# Patient Record
Sex: Female | Born: 1990 | Race: Black or African American | Hispanic: No | Smoking: Never smoker
Health system: Southern US, Community
[De-identification: ages and names within clinical notes are randomized; demographics above are authoritative.]

## PROBLEM LIST (undated history)

## (undated) ENCOUNTER — Inpatient Hospital Stay (HOSPITAL_COMMUNITY): Payer: Self-pay

## (undated) DIAGNOSIS — D649 Anemia, unspecified: Secondary | ICD-10-CM

## (undated) DIAGNOSIS — Z8279 Family history of other congenital malformations, deformations and chromosomal abnormalities: Secondary | ICD-10-CM

## (undated) HISTORY — PX: NO PAST SURGERIES: SHX2092

## (undated) HISTORY — DX: Family history of other congenital malformations, deformations and chromosomal abnormalities: Z82.79

## (undated) HISTORY — DX: Anemia, unspecified: D64.9

## (undated) SURGERY — Surgical Case
Anesthesia: *Unknown

---
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
## 2011-07-05 ENCOUNTER — Emergency Department (HOSPITAL_COMMUNITY)
Admission: EM | Admit: 2011-07-05 | Discharge: 2011-07-05 | Disposition: A | Discharge status: Home / Self Care | Payer: Medicaid Other | Attending: Emergency Medicine | Admitting: Emergency Medicine

## 2011-07-05 ENCOUNTER — Encounter (HOSPITAL_COMMUNITY): Payer: Self-pay | Admitting: Emergency Medicine

## 2011-07-05 ENCOUNTER — Emergency Department (HOSPITAL_COMMUNITY): Payer: Medicaid Other

## 2011-07-05 DIAGNOSIS — Z3490 Encounter for supervision of normal pregnancy, unspecified, unspecified trimester: Secondary | ICD-10-CM

## 2011-07-05 DIAGNOSIS — R109 Unspecified abdominal pain: Secondary | ICD-10-CM | POA: Insufficient documentation

## 2011-07-05 DIAGNOSIS — O200 Threatened abortion: Secondary | ICD-10-CM

## 2011-07-05 LAB — ABO/RH: ABO/RH(D): B POS

## 2011-07-05 LAB — URINALYSIS, ROUTINE W REFLEX MICROSCOPIC
Leukocytes, UA: NEGATIVE
Nitrite: NEGATIVE
Protein, ur: NEGATIVE mg/dL
Urobilinogen, UA: 1 mg/dL (ref 0.0–1.0)

## 2011-07-05 LAB — URINE MICROSCOPIC-ADD ON

## 2011-07-05 LAB — HCG, QUANTITATIVE, PREGNANCY: hCG, Beta Chain, Quant, S: 8746 m[IU]/mL — ABNORMAL HIGH (ref <5)

## 2011-07-05 LAB — CBC
MCHC: 33 g/dL (ref 30.0–36.0)
RDW: 12.9 % (ref 11.5–15.5)

## 2011-07-05 LAB — POCT PREGNANCY, URINE: Preg Test, Ur: POSITIVE — AB

## 2011-07-05 NOTE — ED Notes (Signed)
Patient transported to Ultrasound 

## 2011-07-05 NOTE — ED Notes (Signed)
C/o abd cramping all day and light amount of dark brown vaginal bleeding x 1 hour.  Pt reports she is [redacted] weeks pregnant.

## 2011-07-05 NOTE — Discharge Instructions (Signed)
Threatened Miscarriage  Bleeding during the first 20 weeks of pregnancy is common. This is sometimes called a threatened miscarriage. This is a pregnancy that is threatening to end before the twentieth week of pregnancy. Often this bleeding stops with bed rest or decreased activities as suggested by your caregiver and the pregnancy continues without any more problems. You may be asked to not have sexual intercourse, have orgasms or use tampons until further notice. Sometimes a threatened miscarriage can progress to a complete or incomplete miscarriage. This may or may not require further treatment. Some miscarriages occur before a woman misses a menstrual period and knows she is pregnant.  Miscarriages occur in 15 to 20% of all pregnancies and usually occur during the first 13 weeks of the pregnancy. The exact cause of a miscarriage is usually never known. A miscarriage is natures way of ending a pregnancy that is abnormal or would not make it to term. There are some things that may put you at risk to have a miscarriage, such as:   Hormone problems.   Infection of the uterus or cervix.   Chronic illness, diabetes for example, especially if it is not controlled.   Abnormal shaped uterus.   Fibroids in the uterus.   Incompetent cervix (the cervix is too weak to hold the baby).   Smoking.   Drinking too much alcohol. It's best not to drink any alcohol when you are pregnant.   Taking illegal drugs.  TREATMENT   When a miscarriage becomes complete and all products of conception (all the tissue in the uterus) have been passed, often no treatment is needed. If you think you passed tissue, save it in a container and take it to your doctor for evaluation. If the miscarriage is incomplete (parts of the fetus or placenta remain in the uterus), further treatment may be needed. The most common reason for further treatment is continued bleeding (hemorrhage) because pregnancy tissue did not pass out of the uterus. This  often occurs if a miscarriage is incomplete. Tissue left behind may also become infected. Treatment usually is dilatation and curettage (the removal of the remaining products of pregnancy. This can be done by a simple sucking procedure (suction curettage) or a simple scraping of the inside of the uterus. This may be done in the hospital or in the caregiver's office. This is only done when your caregiver knows that there is no chance for the pregnancy to proceed to term. This is determined by physical examination, negative pregnancy test, falling pregnancy hormone count and/or, an ultrasound revealing a dead fetus.  Miscarriages are often a very emotional time for prospective mothers and fathers. This is not you or your partners fault. It did not occur because of an inadequacy in you or your partner. Nearly all miscarriages occur because the pregnancy has started off wrongly. At least half of these pregnancies have a chromosomal abnormality. It is almost always not inherited. Others may have developmental problems with the fetus or placenta. This does not always show up even when the products miscarried are studied under the microscope. The miscarriage is nearly always not your fault and it is not likely that you could have prevented it from happening. If you are having emotional and grieving problems, talk to your health care provider and even seek counseling, if necessary, before getting pregnant again. You can begin trying for another pregnancy as soon as your caregiver says it is OK.  HOME CARE INSTRUCTIONS    Your caregiver may order   bed rest depending on how much bleeding and cramping you are having. You may be limited to only getting up to go to the bathroom. You may be allowed to continue light activity. You may need to make arrangements for the care of your other children and for any other responsibilities.   Keep track of the number of pads you use each day, how often you have to change pads and how  saturated (soaked) they are. Record this information.   DO NOT USE TAMPONS. Do not douche, have sexual intercourse or orgasms until approved by your caregiver.   You may receive a follow up appointment for re-evaluation of your pregnancy and a repeat blood test. Re-evaluation often occurs after 2 days and again in 4 to 6 weeks. It is very important that you follow-up in the recommended time period.   If you are Rh negative and the father is Rh positive or you do not know the fathers' blood type, you may receive a shot (Rh immune globulin) to help prevent abnormal antibodies that can develop and affect the baby in any future pregnancies.  SEEK IMMEDIATE MEDICAL CARE IF:   You have severe cramps in your stomach, back, or abdomen.   You have a sudden onset of severe pain in the lower part of your abdomen.   You develop chills.   You run an unexplained temperature of 101 F (38.3 C) or higher.   You pass large clots or tissue. Save any tissue for your caregiver to inspect.   Your bleeding increases or you become light-headed, weak, or have fainting episodes.   You have a gush of fluid from your vagina.   You pass out. This could mean you have a tubal (ectopic) pregnancy.  Document Released: 01/21/2005 Document Revised: 01/10/2011 Document Reviewed: 09/07/2007  [employer] Patient Information 2012 [employer].

## 2011-07-05 NOTE — ED Notes (Signed)
Patient verbalized complete understanding of all the d/c home instructions

## 2011-07-05 NOTE — ED Provider Notes (Signed)
I have seen and examined this patient with the resident.  I agree with the resident's note, assessment and plan except as indicated.    Patient with vaginal bleeding and abdominal cramping with a positive pregnancy test concerning for possible ectopic pregnancy.  Will obtain ultrasound and appropriate labs to further evaluate this possibility.  Nat Christen, MD 07/05/11 [phone]

## 2011-07-05 NOTE — ED Notes (Signed)
Patient is resting comfortably. 

## 2011-07-05 NOTE — ED Provider Notes (Signed)
History     CSN: [id number]  Arrival date & time 07/05/11  1726   First MD Initiated Contact with Patient 07/05/11 1809      Chief Complaint  Patient presents with  . Vaginal Bleeding    (Consider location/radiation/quality/duration/timing/severity/associated sxs/prior treatment) HPI Comments: Some spotting and lower abdominal cramping today c/w menstraul periods.  [redacted] weeks pregnant.  Patient is a 21 y.o. female presenting with vaginal bleeding. The history is provided by the patient.  Vaginal Bleeding This is a new problem. The current episode started today. The problem occurs constantly. The problem has been unchanged. Associated symptoms include abdominal pain (lower abdominal pain). Pertinent negatives include no chest pain, congestion, fever, rash or vomiting. The symptoms are aggravated by nothing. She has tried nothing for the symptoms.    History reviewed. No pertinent past medical history.  History reviewed. No pertinent past surgical history.  No family history on file.  History  Substance Use Topics  . Smoking status: Never Smoker   . Smokeless tobacco: Not on file  . Alcohol Use: Yes    OB History    Grav Para Term Preterm Abortions TAB SAB Ect Mult Living   1               Review of Systems  Constitutional: Negative for fever and activity change.  HENT: Negative for congestion.   Eyes: Negative for visual disturbance.  Respiratory: Negative for chest tightness and shortness of breath.   Cardiovascular: Negative for chest pain and leg swelling.  Gastrointestinal: Positive for abdominal pain (lower abdominal pain). Negative for vomiting.  Genitourinary: Positive for vaginal bleeding. Negative for dysuria.  Skin: Negative for rash.  Neurological: Negative for syncope.  Psychiatric/Behavioral: Negative for behavioral problems.    Allergies  Review of patient's allergies indicates no known allergies.  Home Medications  No current outpatient  prescriptions on file.  BP 100/66  Pulse 78  Temp(Src) 98.2 F (36.8 C) (Oral)  Resp 16  SpO2 100%  LMP 05/22/2011  Physical Exam  Constitutional: She is oriented to person, place, and time. She appears well-developed and well-nourished.  HENT:  Head: Normocephalic and atraumatic.  Eyes: Conjunctivae and EOM are normal. Pupils are equal, round, and reactive to light. No scleral icterus.  Neck: Normal range of motion. Neck supple.  Cardiovascular: Normal rate and regular rhythm.  Exam reveals no gallop and no friction rub.   No murmur heard. Pulmonary/Chest: Effort normal and breath sounds normal. No respiratory distress. She has no wheezes. She has no rales. She exhibits no tenderness.  Abdominal: Soft. She exhibits no distension and no mass. There is no tenderness. There is no rebound and no guarding.  Genitourinary:       No CMT.  No blood in vault.  No discharge.  Os closed.  Musculoskeletal: Normal range of motion.  Neurological: She is alert and oriented to person, place, and time. She has normal reflexes. No cranial nerve deficit.  Skin: Skin is warm and dry. No rash noted.  Psychiatric: She has a normal mood and affect. Her behavior is normal. Judgment and thought content normal.    ED Course  Procedures (including critical care time)  Labs Reviewed  URINALYSIS, ROUTINE W REFLEX MICROSCOPIC - Abnormal; Notable for the following:    Hgb urine dipstick TRACE (*)    All other components within normal limits  POCT PREGNANCY, URINE - Abnormal; Notable for the following:    Preg Test, Ur POSITIVE (*)    All  other components within normal limits  CBC - Abnormal; Notable for the following:    Hemoglobin 11.4 (*)    HCT 34.5 (*)    MCV 77.4 (*)    MCH 25.6 (*)    All other components within normal limits  HCG, QUANTITATIVE, PREGNANCY - Abnormal; Notable for the following:    hCG, Beta Chain, [name], S 8746 (*)    All other components within normal limits  WET PREP, GENITAL  - Abnormal; Notable for the following:    Clue Cells Wet Prep HPF POC FEW (*)    WBC, Wet Prep HPF POC FEW (*)    All other components within normal limits  URINE MICROSCOPIC-ADD ON - Abnormal; Notable for the following:    Squamous Epithelial / LPF MANY (*)    All other components within normal limits  ABO/RH  PREGNANCY, URINE  GC/CHLAMYDIA PROBE AMP, GENITAL   US Ob Comp Less 14 Wks  07/05/2011  *RADIOLOGY REPORT*  Clinical Data: VAGINAL BLEEDING, pregnant, r/o ectopic,protocol; ;  OBSTETRIC <14 WK [place] AND TRANSVAGINAL OB US  Technique: Both transabdominal and transvaginal ultrasound examinations were performed for complete evaluation of the gestation as well as the maternal uterus, adnexal regions, and pelvic cul-de-sac.  Comparison: None.  Findings: There is a single intrauterine gestation.  Mean sac diameter is 13.7 mm for estimated gestational age of [redacted] weeks 2 days.  Yolk sac is present.  No fetal pole currently visualized. No subchorionic hemorrhage.  Uterus is retroverted.  Corpus luteal cyst on the right ovary.  No adnexal masses.  Trace free fluid.  IMPRESSION: Early intrauterine pregnancy, 6 weeks 2 days by mean sac diameter. No fetal pole identified currently.  There is a yolk sac present. Recommend follow-up ultrasound in 7-10 days.  Retroverted uterus.  Original Report Authenticated By: Cyndie Chime, M.D.   US Ob Transvaginal  07/05/2011  *RADIOLOGY REPORT*  Clinical Data: VAGINAL BLEEDING, pregnant, r/o ectopic,protocol; ;  OBSTETRIC <14 WK [place] AND TRANSVAGINAL OB US  Technique: Both transabdominal and transvaginal ultrasound examinations were performed for complete evaluation of the gestation as well as the maternal uterus, adnexal regions, and pelvic cul-de-sac.  Comparison: None.  Findings: There is a single intrauterine gestation.  Mean sac diameter is 13.7 mm for estimated gestational age of [redacted] weeks 2 days.  Yolk sac is present.  No fetal pole currently visualized. No subchorionic  hemorrhage.  Uterus is retroverted.  Corpus luteal cyst on the right ovary.  No adnexal masses.  Trace free fluid.  IMPRESSION: Early intrauterine pregnancy, 6 weeks 2 days by mean sac diameter. No fetal pole identified currently.  There is a yolk sac present. Recommend follow-up ultrasound in 7-10 days.  Retroverted uterus.  Original Report Authenticated By: Cyndie Chime, M.D.     1. Pregnant   2. Threatened abortion       MDM  Some spotting and lower abdominal cramping today c/w menstraul periods.  [redacted] weeks pregnant.  Feels like prior threatened abortions.  No lightheadedness or dizziness.  Labs unconcerning.  IUP on [place].  OB f/u.  Pt comfortable with plan and will follow up.         Army Chaco, MD 07/05/11 2121

## 2011-07-06 LAB — GC/CHLAMYDIA PROBE AMP, GENITAL: GC Probe Amp, Genital: NEGATIVE

## 2011-07-09 ENCOUNTER — Other Ambulatory Visit: Payer: Self-pay | Admitting: Nurse Practitioner

## 2011-07-09 DIAGNOSIS — Z8279 Family history of other congenital malformations, deformations and chromosomal abnormalities: Secondary | ICD-10-CM

## 2011-07-09 DIAGNOSIS — O26849 Uterine size-date discrepancy, unspecified trimester: Secondary | ICD-10-CM

## 2011-07-12 ENCOUNTER — Other Ambulatory Visit: Payer: Self-pay | Admitting: Nurse Practitioner

## 2011-07-12 ENCOUNTER — Ambulatory Visit (HOSPITAL_COMMUNITY)
Admission: RE | Admit: 2011-07-12 | Discharge: 2011-07-12 | Disposition: A | Discharge status: Home / Self Care | Payer: Medicaid Other | Source: Ambulatory Visit | Attending: Nurse Practitioner | Admitting: Nurse Practitioner

## 2011-07-12 ENCOUNTER — Encounter (HOSPITAL_COMMUNITY): Payer: Self-pay

## 2011-07-12 DIAGNOSIS — O352XX0 Maternal care for (suspected) hereditary disease in fetus, not applicable or unspecified: Secondary | ICD-10-CM | POA: Insufficient documentation

## 2011-07-12 DIAGNOSIS — Z3689 Encounter for other specified antenatal screening: Secondary | ICD-10-CM | POA: Insufficient documentation

## 2011-07-12 DIAGNOSIS — Z8279 Family history of other congenital malformations, deformations and chromosomal abnormalities: Secondary | ICD-10-CM

## 2011-07-12 DIAGNOSIS — O26849 Uterine size-date discrepancy, unspecified trimester: Secondary | ICD-10-CM

## 2011-07-12 NOTE — Progress Notes (Signed)
Patient seen today  For ultrasound.  See full report in [facility].  Alpha Gula, MD

## 2011-07-16 ENCOUNTER — Encounter (HOSPITAL_COMMUNITY): Payer: Self-pay

## 2011-07-16 NOTE — Progress Notes (Signed)
Genetic Counseling  High-Risk Gestation Note  Appointment Date:  07/12/2011 Referred By: Elizabeth Baltimore, NP Date of Birth:  [birth year]   Pregnancy History: [id number] Estimated Date of Delivery: 03/04/12 Estimated Gestational Age: [redacted]w[redacted]d Attending: Alpha Gula, MD   [name] was seen for genetic counseling because of a previous pregnancy with congenital anomalies.  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 Given this finding, the patient elected to terminate that pregnancy. 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 However, the patient does not recall any chromosome analysis being performed and is not sure how this diagnosis was determined. We discussed the option of obtaining and reviewing medical records from the previous pregnancy to determined whether or not an underlying diagnosis had been determined. The patient declined this option at this time, stating that she received care in [place] but did not remember the specific physician or office.   We discussed that a cystic hygroma describes a septated fluid filled sac at the back of the neck that typically results from failure of the fetal lymphatic system.  We discussed the various etiologies for a hygroma including normal variation (immature lymphatic system), a chromosome condition (in approximately 50-60% of cases), single gene condition, or a congenital anomaly (heart defect).   We reviewed chromosomes and nondisjunction in detail.  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  The missing X chromosome is present from the time of conception, and could have resulted from maternal meiotic nondisjunction, or more commonly (80%) from  paternal nondisjunction.  We discussed that Turner syndrome is a sporadic condition and is not the result of anything that a parent did or did not do.  She was counseled that when a child has missing or extra chromosome material, that child has an increased risk to have birth defects, neurodevelopmental differences, and a variety of health concerns depending on the specific chromosome alteration. Turner syndrome is a multisystem disorder occuring in approximately 1 in 20,000 female live births.  Because every cell of the body is missing an X chromosome, virtually all organ systems can be affected.  Lymphedema, cardiac and renal anomalies, short stature, developmental delay, and infertility due to ovarian dysfunction are commonly described in girls with this condition.  Pregnancies with Turner syndrome have an increased risk for miscarriage or stillbirth.  The risk for neonatal death is also increased.   Once a couple has had one pregnancy with Turner syndrome, the risk of recurrence for a future pregnancy does not appear to be increased above the risk of the general population.  Additionally, the chance to have a pregnancy with Turner syndrome does not increase with maternal age as it does with other extra chromosome conditions, such as Down syndrome. To summarize, [name] was counseled that the current pregnancy is not expected to be at increased risk for Turner syndrome, even in the event that her previous pregnancy was confirmed to have Turner syndrome. Additionally, given the patient's age alone, the pregnancy is not considered to be at increased risk for other fetal aneuploidy. However, we do not currently have confirmation of Turner syndrome or other underlying etiology for cystic hygroma in the patient's first pregnancy. Recurrence risk for cystic hygroma depends upon  the underlying etiology. In the absence of a determined etiology, accurate recurrence risk assessment for the current pregnancy cannot  be determined.    However, we did review available screening and diagnostic options for chromosome conditions, including Turner syndrome and reviewed first trimester nuchal translucency assessment at 11-[redacted] weeks gestation to assess for the presence of a cystic hygroma. Specifically, we reviewed the option of First trimester screening, noninvasive prenatal testing (NIPT), also referred to as cell free fetal DNA (cffDNA) testing, and detailed ultrasound.  NIPT analyzes cffDNA in maternal circulation from chromosomes 21, 13, 18, X, and Y.  Turner syndrome is screened for with this testing methodology.  Specifically, we discussed that NIPT analyzes cell free fetal DNA found in the maternal circulation. This test is not diagnostic for chromosome conditions, but can provide information regarding the presence or absence of extra fetal DNA for chromosomes 13, 18, 21, X, and Y, and missing fetal DNA for chromosome X and Y (Turner syndrome). Thus, it would not identify or rule out all genetic conditions. The reported detection rate is greater than 99% for Trisomy 21, greater than 98% for Trisomy 18, and is approximately 80% (8 out of 10) for Trisomy 13. The false positive rate is reported to be less than 0.1% for any of these conditions. In addition, detailed ultrasound can detect many of the anomalies commonly observed in fetuses with Turner syndrome, as well as other chromosome conditions. However, we do not have confirmation that a chromosome condition was present in the patient's first pregnancy.   We also reviewed the availability of diagnostic testing options including both CVS and amniocentesis.  The risks, benefits, and limitations of each of these options were reviewed in detail.  She understands that the chance for a loss of pregnancy related to CVS/amniocentesis is estimated to be greater than the risk of aneuploidy in the current pregnancy.  After thoughtful consideration of these options, [name]  elected to return for nuchal translucency ultrasound assessment on 08/23/11 and proceed with NIPT ([facility]) at that time. She declined CVS and amniocentesis at this time. An ultrasound was performed at the time of today's visit, which visualized the pregnancy to be [redacted]w[redacted]d gestation. Complete ultrasound results reported separately.   Additionally, the patient reported a female maternal first cousin once removed (her mother's maternal uncle's son) with mental retardation. An underlying cause was not known. We discussed that there are many different causes of mental retardation such as genetic differences, sporadic causes, or injuries.  A specific diagnosis for mental retardation can be determined in approximately 50% of cases.  In the remaining 50% of cases, a diagnosis may not ever be determined.  Without more specific information, potential genetic risks cannot be determined.  The patient was advised to call if she is able to find out more information regarding a specific diagnosis.  Without further information regarding the provided family history, an accurate genetic risk cannot be calculated. Further genetic counseling is warranted if more information is obtained.  [name] was provided with written information regarding sickle cell anemia (SCA) including the carrier frequency and incidence in the African-American population, the availability of carrier testing and prenatal diagnosis if indicated.  In addition, we discussed that hemoglobinopathies are routinely screened for as part of the [place] newborn screening panel.  She reported that she previously had sickle cell screening, which was negative. We do not currently have medical records confirming this report and indicating whether this screened for all hemoglobin variants or hemoglobin  S only. If not previously performed, a hemoglobin electrophoresis is available, if desired, to assess for hemoglobin variants.    [name] denied exposure to  environmental toxins or chemical agents. She reported one dental xray 4 weeks ago. The all-or-none period was discussed, meaning exposures that occur in the first 4 weeks of gestation are typically thought to either not affect the pregnancy at all or result in a miscarriage. She denied the use of alcohol, tobacco or street drugs. She denied significant viral illnesses during the course of her pregnancy. Her medical and surgical histories were noncontributory.   I counseled [name] for approximately 25 minutes regarding the above risks and available options.     Quinn Plowman, MS,  Certified Genetic Counselor 07/16/2011

## 2011-07-27 ENCOUNTER — Encounter (HOSPITAL_COMMUNITY): Payer: Self-pay | Admitting: Nurse Practitioner

## 2011-07-27 ENCOUNTER — Emergency Department (HOSPITAL_COMMUNITY)
Admission: EM | Admit: 2011-07-27 | Discharge: 2011-07-27 | Disposition: A | Discharge status: Home / Self Care | Payer: Medicaid Other | Attending: Emergency Medicine | Admitting: Emergency Medicine

## 2011-07-27 DIAGNOSIS — B9689 Other specified bacterial agents as the cause of diseases classified elsewhere: Secondary | ICD-10-CM

## 2011-07-27 DIAGNOSIS — O200 Threatened abortion: Secondary | ICD-10-CM | POA: Insufficient documentation

## 2011-07-27 DIAGNOSIS — N760 Acute vaginitis: Secondary | ICD-10-CM

## 2011-07-27 DIAGNOSIS — D649 Anemia, unspecified: Secondary | ICD-10-CM | POA: Insufficient documentation

## 2011-07-27 DIAGNOSIS — O99019 Anemia complicating pregnancy, unspecified trimester: Secondary | ICD-10-CM | POA: Insufficient documentation

## 2011-07-27 LAB — WET PREP, GENITAL: Yeast Wet Prep HPF POC: NONE SEEN

## 2011-07-27 LAB — URINALYSIS, ROUTINE W REFLEX MICROSCOPIC
Bilirubin Urine: NEGATIVE
Glucose, UA: NEGATIVE mg/dL
Hgb urine dipstick: NEGATIVE
Specific Gravity, Urine: 1.013 (ref 1.005–1.030)
Urobilinogen, UA: 1 mg/dL (ref 0.0–1.0)

## 2011-07-27 LAB — CBC
HCT: 28.4 % — ABNORMAL LOW (ref 36.0–46.0)
MCH: 22.4 pg — ABNORMAL LOW (ref 26.0–34.0)
MCHC: 28.5 g/dL — ABNORMAL LOW (ref 30.0–36.0)
RDW: 13.4 % (ref 11.5–15.5)

## 2011-07-27 LAB — HCG, QUANTITATIVE, PREGNANCY: hCG, Beta Chain, Quant, S: 86158 m[IU]/mL — ABNORMAL HIGH (ref <5)

## 2011-07-27 LAB — POCT PREGNANCY, URINE: Preg Test, Ur: POSITIVE — AB

## 2011-07-27 LAB — TYPE AND SCREEN: Antibody Screen: NEGATIVE

## 2011-07-27 MED ORDER — FERROUS SULFATE 325 (65 FE) MG PO TABS: 325.0000 mg | ORAL_TABLET | Freq: Every day | ORAL | Status: DC

## 2011-07-27 MED ORDER — METRONIDAZOLE 500 MG PO TABS: 500.0000 mg | ORAL_TABLET | Freq: Two times a day (BID) | ORAL | Status: AC

## 2011-07-27 NOTE — ED Provider Notes (Signed)
History     CSN: [id number]  Arrival date & time 07/27/11  1205   First MD Initiated Contact with Patient 07/27/11 1511      Chief Complaint  Patient presents with  . Vaginal Bleeding    (Consider location/radiation/quality/duration/timing/severity/associated sxs/prior treatment) HPI History from patient. 21 year old female who is approximately [redacted] weeks pregnant who presents with vaginal bleeding. She states this began today while she was at work. Bleeding is described as spotting. She has had some slight associated dizziness when rising from a chair since yesterday. She had similar symptoms several weeks ago and was seen in the emergency department on May 31. A transvaginal ultrasound was performed at that visit which showed an intrauterine pregnancy with dates of approximately 6 weeks. Pelvic exam did not show any blood in the vaginal vault. Patient reports that she has not had any bleeding since that visit. She has not yet been seen by OB (OB is Dr. Clearance Coots with [facility]). Patient reports this is her third pregnancy and she did have some spotting with her other 2 pregnancies early on. She denies any nausea or vomiting. She has had some slight lower abdominal and back cramping.  History reviewed. No pertinent past medical history.  History reviewed. No pertinent past surgical history.  History reviewed. No pertinent family history.  History  Substance Use Topics  . Smoking status: Never Smoker   . Smokeless tobacco: Not on file  . Alcohol Use: Yes    OB History    Grav Para Term Preterm Abortions TAB SAB Ect Mult Living   1               Review of Systems  Constitutional: Negative for fever, chills, activity change and appetite change.  Respiratory: Negative for chest tightness and shortness of breath.   Cardiovascular: Negative for chest pain.  Gastrointestinal: Negative for nausea, vomiting and abdominal pain.  Genitourinary: Positive for vaginal bleeding. Negative for  dysuria, decreased urine volume and vaginal discharge.  Musculoskeletal: Negative for myalgias.  Skin: Negative for color change and rash.  Neurological: Negative for dizziness and weakness.    Allergies  Pollen extract  Home Medications   Current Outpatient Rx  Name Route Sig Dispense Refill  . PRENATAL MULTIVITAMIN CH Oral Take 1 tablet by mouth daily.      BP 101/45  Pulse 72  Temp 98.3 F (36.8 C) (Oral)  Resp 16  Ht 5\' 4"  (1.626 m)  Wt 160 lb (72.576 kg)  BMI 27.46 kg/m2  SpO2 100%  LMP 05/22/2011  Physical Exam  Nursing note and vitals reviewed. Constitutional: She appears well-developed and well-nourished. No distress.  HENT:  Head: Normocephalic and atraumatic.  Eyes:       Normal appearance  Neck: Normal range of motion.  Cardiovascular: Normal rate, regular rhythm and normal heart sounds.   Pulmonary/Chest: Effort normal and breath sounds normal. She exhibits no tenderness.  Abdominal: Soft. Bowel sounds are normal. There is no tenderness. There is no rebound and no guarding.       FHT 150 per Dr. Fonnie Jarvis  Genitourinary:       Female chaperone present during exam Os closed, no blood seen in vaginal vault, no CMT, mod amt thick white dc  Musculoskeletal: Normal range of motion.  Neurological: She is alert.  Skin: Skin is warm and dry. She is not diaphoretic.  Psychiatric: She has a normal mood and affect.    ED Course  Procedures (including critical care time)  Labs  Reviewed  HCG, QUANTITATIVE, PREGNANCY - Abnormal; Notable for the following:    hCG, Beta Chain, [place] Longest [address] (*)     All other components within normal limits  CBC - Abnormal; Notable for the following:    RBC 3.62 (*)     Hemoglobin 8.1 (*)     HCT 28.4 (*)     MCH 22.4 (*)     MCHC 28.5 (*)     All other components within normal limits  URINALYSIS, ROUTINE W REFLEX MICROSCOPIC - Abnormal; Notable for the following:    APPearance CLOUDY (*)     All other components within normal  limits  WET PREP, GENITAL - Abnormal; Notable for the following:    Clue Cells Wet Prep HPF POC MANY (*)     WBC, Wet Prep HPF POC FEW (*)     All other components within normal limits  POCT PREGNANCY, URINE - Abnormal; Notable for the following:    Preg Test, Ur POSITIVE (*)     All other components within normal limits  TYPE AND SCREEN   No results found.   1. Threatened abortion   2. Anemia       MDM  Pt presents with vaginal spotting x several hours, [redacted] wks pregnant. Seen for same 3 weeks ago and had [place] which showed 6 wk IUP. On exam, no blood appreciated, os closed. Wet prep - large clue cells, will tx. However, pt has had a drop in hgb from 11.4 to 8.1 in past 3 weeks with no reported spotting between 1st episode on the 31st and today. She is not orthostatic but does reports occasional dizziness with rising quickly from a chair.  5:40 PM - spoke with Dr. Clearance Coots re: pt's hgb - he recs starting pt on iron, instructs to have her call office on Mon to schedule f/u. Pt instructed on this. Instructed to return to [facility] with further sx.       Grant Fontana, PA-C 07/27/11 1754

## 2011-07-27 NOTE — ED Provider Notes (Signed)
OB Harper  ED bedside US IUP FHR 150  Intermittent mild cramps and light spotting but Hb drop from >11 to 8 in few weeks so PA will d/w OB for close f/u  Medical screening examination/treatment/procedure(s) were conducted as a shared visit with non-physician practitioner(s) and myself.  I personally evaluated the patient during the encounter  Hurman Horn, MD 08/01/11 0157

## 2011-07-27 NOTE — Discharge Instructions (Signed)
You did not appear to have any bleeding on exam today, and the baby's heart tones are normal. Your bloodwork appeared normal except your hemoglobin was down as compared with May 31st. You have been given a prescription for iron pills - keep taking this along with your prenatal vitamin. You may want to take a stool softener such as Colace with this as it can make you constipated. It's important that you follow up with Dr. Clearance Coots as soon as possible for a formal OB check. Please call on Monday for an appointment. Call your OB or return to the maternity admissions unit at [facility] with repeat symptoms.  Iron Deficiency Anemia  Anemia is when you have a low number of healthy red blood cells. HOME CARE   Ask your doctor or dietician what foods you should eat.   Take iron and vitamins as told by your doctor.   Eat foods that have iron in them. This includes liver, lean beef, whole-grain bread, eggs, dried fruit, and dark green leafy vegetables.  GET HELP RIGHT AWAY IF:  You pass out (faint).   You have chest pain.   You feel sick to your stomach (nauseous) or throw up (vomit).   You get very short of breath with activity.   You are weak.   You are thirstier than normal.   You have a fast heartbeat.   You start to sweat or become lightheaded when getting up from a chair or bed.  MAKE SURE YOU:  Understand these instructions.   Will watch your condition.   Will get help right away if you are not doing well or get worse.  Document Released: 02/23/2010 Document Revised: 01/10/2011 Document Reviewed: 02/23/2010 [employer] Patient Information 2012 [place], [place].

## 2011-07-27 NOTE — ED Notes (Signed)
Pt states she is approx [redacted] weeks pregnant. Reports while at work approx 45 minutes ago, onset light spotting in underwear and R lower back/abd cramps.

## 2011-07-28 NOTE — ED Provider Notes (Signed)
Medical screening examination/treatment/procedure(s) were conducted as a shared visit with non-physician practitioner(s) and myself.  I personally evaluated the patient during the encounter  Merary Garguilo M Netasha Wehrli, MD 07/28/11 1252 

## 2011-08-23 ENCOUNTER — Other Ambulatory Visit: Payer: Self-pay

## 2011-08-23 ENCOUNTER — Other Ambulatory Visit (HOSPITAL_COMMUNITY): Payer: Self-pay | Admitting: Maternal and Fetal Medicine

## 2011-08-23 ENCOUNTER — Ambulatory Visit (HOSPITAL_COMMUNITY)
Admission: RE | Admit: 2011-08-23 | Discharge: 2011-08-23 | Disposition: A | Discharge status: Home / Self Care | Payer: Medicaid Other | Source: Ambulatory Visit | Attending: Nurse Practitioner | Admitting: Nurse Practitioner

## 2011-08-23 VITALS — BP 103/67 | HR 85 | Wt 155.5 lb

## 2011-08-23 DIAGNOSIS — Z8279 Family history of other congenital malformations, deformations and chromosomal abnormalities: Secondary | ICD-10-CM

## 2011-08-23 DIAGNOSIS — Z3689 Encounter for other specified antenatal screening: Secondary | ICD-10-CM | POA: Insufficient documentation

## 2011-08-23 DIAGNOSIS — O351XX0 Maternal care for (suspected) chromosomal abnormality in fetus, not applicable or unspecified: Secondary | ICD-10-CM | POA: Insufficient documentation

## 2011-08-23 DIAGNOSIS — Z1389 Encounter for screening for other disorder: Secondary | ICD-10-CM

## 2011-08-23 DIAGNOSIS — O352XX0 Maternal care for (suspected) hereditary disease in fetus, not applicable or unspecified: Secondary | ICD-10-CM | POA: Insufficient documentation

## 2011-08-23 DIAGNOSIS — O3510X0 Maternal care for (suspected) chromosomal abnormality in fetus, unspecified, not applicable or unspecified: Secondary | ICD-10-CM | POA: Insufficient documentation

## 2011-08-23 NOTE — Progress Notes (Signed)
Patient seen today  for ultrasound.  See full report in [facility]N.  Alpha Gula, MD  Single IUP at 12 2/7 weeks NT of 1.7 mm noted.  Nasal bone visulized. After counseling, the patient elected to undergo NIPS (Harmony test)  Recommend follow up ultrasound in 6 weeks for anatomy.

## 2011-09-05 ENCOUNTER — Telehealth (HOSPITAL_COMMUNITY): Payer: Self-pay | Admitting: MS"

## 2011-09-05 NOTE — Telephone Encounter (Signed)
Called [name] to discuss her Harmony, cell free fetal DNA testing.  We reviewed that these are within normal limits, showing a less than 1 in 10,000 risk for trisomies 21, 18 and 13.  We reviewed that this testing identifies > 99% of pregnancies with trisomy 21, >97% of pregnancies with trisomy 11, and >80% with trisomy 11; the false positive rate is <0.1% for all conditions.  Discussed that this testing indicated the expected number of sex chromosomes, reporting >99% probability of XX (female) fetus. She understands that this testing does not identify all genetic conditions.  All questions were answered to her satisfaction, she was encouraged to call with additional questions or concerns.  Quinn Plowman, MS Patent attorney

## 2011-09-24 ENCOUNTER — Ambulatory Visit (HOSPITAL_COMMUNITY)
Admission: RE | Admit: 2011-09-24 | Discharge: 2011-09-24 | Disposition: A | Discharge status: Home / Self Care | Payer: Medicaid Other | Source: Ambulatory Visit | Attending: Nurse Practitioner | Admitting: Nurse Practitioner

## 2011-09-24 ENCOUNTER — Other Ambulatory Visit (HOSPITAL_COMMUNITY): Payer: Self-pay | Admitting: Maternal and Fetal Medicine

## 2011-09-24 ENCOUNTER — Encounter (HOSPITAL_COMMUNITY): Payer: Self-pay

## 2011-09-24 VITALS — BP 99/52 | HR 82 | Wt 158.5 lb

## 2011-09-24 DIAGNOSIS — Z363 Encounter for antenatal screening for malformations: Secondary | ICD-10-CM | POA: Insufficient documentation

## 2011-09-24 DIAGNOSIS — O358XX0 Maternal care for other (suspected) fetal abnormality and damage, not applicable or unspecified: Secondary | ICD-10-CM | POA: Insufficient documentation

## 2011-09-24 DIAGNOSIS — Z1389 Encounter for screening for other disorder: Secondary | ICD-10-CM

## 2011-09-24 DIAGNOSIS — O352XX0 Maternal care for (suspected) hereditary disease in fetus, not applicable or unspecified: Secondary | ICD-10-CM | POA: Insufficient documentation

## 2011-09-24 DIAGNOSIS — Z8279 Family history of other congenital malformations, deformations and chromosomal abnormalities: Secondary | ICD-10-CM

## 2011-09-24 NOTE — Progress Notes (Signed)
[name]  was seen today for an ultrasound appointment.  See full report in [facility].  Alpha Gula, MD  Single IUP at 16 6/7 weeks Normal detailed fetal anatomy; however, limited views of the fetal heart were obtained due to early gestational age No markers associated with aneuploidy noted Normal amniotic fluid volume  Recommend follow up ultrasound in 4 weeks  to reevaluate the fetal heart.

## 2011-10-02 ENCOUNTER — Encounter (HOSPITAL_COMMUNITY): Payer: Self-pay | Admitting: *Deleted

## 2011-10-02 ENCOUNTER — Inpatient Hospital Stay (HOSPITAL_COMMUNITY)
Admission: AD | Admit: 2011-10-02 | Discharge: 2011-10-03 | Disposition: A | Discharge status: Home / Self Care | Payer: Medicaid Other | Source: Ambulatory Visit | Attending: Obstetrics & Gynecology | Admitting: Obstetrics & Gynecology

## 2011-10-02 DIAGNOSIS — R300 Dysuria: Secondary | ICD-10-CM | POA: Insufficient documentation

## 2011-10-02 DIAGNOSIS — O99891 Other specified diseases and conditions complicating pregnancy: Secondary | ICD-10-CM | POA: Insufficient documentation

## 2011-10-02 DIAGNOSIS — M549 Dorsalgia, unspecified: Secondary | ICD-10-CM | POA: Insufficient documentation

## 2011-10-02 DIAGNOSIS — N949 Unspecified condition associated with female genital organs and menstrual cycle: Secondary | ICD-10-CM

## 2011-10-02 DIAGNOSIS — R109 Unspecified abdominal pain: Secondary | ICD-10-CM | POA: Insufficient documentation

## 2011-10-02 LAB — URINALYSIS, ROUTINE W REFLEX MICROSCOPIC
Bilirubin Urine: NEGATIVE
Leukocytes, UA: NEGATIVE
Nitrite: NEGATIVE
Specific Gravity, Urine: >1.03 — ABNORMAL HIGH (ref 1.005–1.030)
pH: 6 (ref 5.0–8.0)

## 2011-10-02 NOTE — MAU Note (Signed)
Pt G3 P1 at 18wks having lower abd and back pain and pain with urination that started today.  White discharge no odor.

## 2011-10-03 DIAGNOSIS — O9989 Other specified diseases and conditions complicating pregnancy, childbirth and the puerperium: Secondary | ICD-10-CM

## 2011-10-03 DIAGNOSIS — R109 Unspecified abdominal pain: Secondary | ICD-10-CM

## 2011-10-03 LAB — WET PREP, GENITAL
Clue Cells Wet Prep HPF POC: NONE SEEN
Trich, Wet Prep: NONE SEEN
Yeast Wet Prep HPF POC: NONE SEEN

## 2011-10-03 NOTE — MAU Provider Note (Signed)
Chief Complaint: Abdominal Pain, Back Pain and Dysuria   First Provider Initiated Contact with Patient 10/03/11 0056     SUBJECTIVE HPI: [name] is a 21 y.o. G3P1011 at [redacted]w[redacted]d by LMP who presents with bilat groin pain since yesterday between 5 and 8/10  on pain scale. Worse upon standing or sneezing. One episode of dysuria, otherwise no urinary Sx. Denies cramping, VB, vaginal discharge, N/V/D/C, fever, chills. Has not tried anything for the pain.   Past Medical History  Diagnosis Date  . No pertinent past medical history    OB History    Grav Para Term Preterm Abortions TAB SAB Ect Mult Living   3 1 1  0 1 1 0 0 0 1     # Outc Date GA Lbr Len/2nd Wgt Sex Del Anes PTL Lv   1 TRM            2 TAB            3 CUR              Past Surgical History  Procedure Date  . No past surgeries    History   Social History  . Marital Status: Legally Separated    Spouse Name: N/A    Number of Children: N/A  . Years of Education: N/A   Occupational History  . Not on file.   Social History Main Topics  . Smoking status: Never Smoker   . Smokeless tobacco: Not on file  . Alcohol Use: No  . Drug Use: No  . Sexually Active: Not Currently   Other Topics Concern  . Not on file   Social History Narrative  . No narrative on file   No current facility-administered medications on file prior to encounter.   Current Outpatient Prescriptions on File Prior to Encounter  Medication Sig Dispense Refill  . ferrous sulfate 325 (65 FE) MG tablet Take 1 tablet (325 mg total) by mouth daily with breakfast.  30 tablet  0  . Prenatal Vit-Fe Fumarate-FA (PRENATAL MULTIVITAMIN) TABS Take 1 tablet by mouth daily.       Allergies  Allergen Reactions  . Pollen Extract Other (See Comments)    Migraines    ROS: Pertinent items in HPI  OBJECTIVE Blood pressure 110/68, pulse 97, temperature 97.9 F (36.6 C), temperature source Oral, resp. rate 18, height 5\' 4"  (1.626 m), weight 71.124 kg (156 lb  12.8 oz), last menstrual period 05/22/2011. GENERAL: Well-developed, well-nourished female in no acute distress.  HEENT: Normocephalic HEART: normal rate RESP: normal effort ABDOMEN: Soft, non-tender. Pos BS x 4. EXTREMITIES: Nontender, no edema NEURO: Alert and oriented SPECULUM EXAM: NEFG, physiologic discharge, no blood noted, cervix clean BIMANUAL: cervix long and closed; uterus 18 cm, no adnexal tenderness or masses FHR: 155 by doppler  LAB RESULTS Results for orders placed during the hospital encounter of 10/02/11 (from the past 24 hour(s))  URINALYSIS, ROUTINE W REFLEX MICROSCOPIC     Status: Abnormal   Collection Time   10/02/11 11:36 PM      Component Value Range   Color, Urine YELLOW  YELLOW   APPearance CLEAR  CLEAR   Specific Gravity, Urine >1.030 (*) 1.005 - 1.030   pH 6.0  5.0 - 8.0   Glucose, UA NEGATIVE  NEGATIVE mg/dL   Hgb urine dipstick NEGATIVE  NEGATIVE   Bilirubin Urine NEGATIVE  NEGATIVE   Ketones, ur 15 (*) NEGATIVE mg/dL   Protein, ur NEGATIVE  NEGATIVE mg/dL  Urobilinogen, UA 1.0  0.0 - 1.0 mg/dL   Nitrite NEGATIVE  NEGATIVE   Leukocytes, UA NEGATIVE  NEGATIVE  WET PREP, GENITAL     Status: Abnormal   Collection Time   10/03/11  1:10 AM      Component Value Range   Yeast Wet Prep HPF POC NONE SEEN  NONE SEEN   Trich, Wet Prep NONE SEEN  NONE SEEN   Clue Cells Wet Prep HPF POC NONE SEEN  NONE SEEN   WBC, Wet Prep HPF POC FEW (*) NONE SEEN    IMAGING NA  ASSESSMENT 1. Round ligament pain    PLAN Discharge home per consult w/ Dr. Tamela Oddi. Comfort measures Tylenol PRN for pain.  Medication List  As of 10/03/2011  5:32 AM   TAKE these medications         ferrous sulfate 325 (65 FE) MG tablet   Take 1 tablet (325 mg total) by mouth daily with breakfast.      prenatal multivitamin Tabs   Take 1 tablet by mouth daily.           Follow-up Information    Schedule an appointment as soon as possible for a visit with Roseanna Rainbow, MD.   Contact information:   [address], [address] [place] [address] [phone]       Follow up with [facility]. (As needed if symptoms worsen)    Contact information:   [address] [place] [address] [phone]         Dorathy Kinsman, [place] 10/03/2011  5:32 AM

## 2011-10-22 ENCOUNTER — Ambulatory Visit (HOSPITAL_COMMUNITY)
Admission: RE | Admit: 2011-10-22 | Discharge: 2011-10-22 | Disposition: A | Discharge status: Home / Self Care | Payer: Medicaid Other | Source: Ambulatory Visit | Attending: Nurse Practitioner | Admitting: Nurse Practitioner

## 2011-10-22 DIAGNOSIS — Z1389 Encounter for screening for other disorder: Secondary | ICD-10-CM | POA: Insufficient documentation

## 2011-10-22 DIAGNOSIS — O358XX0 Maternal care for other (suspected) fetal abnormality and damage, not applicable or unspecified: Secondary | ICD-10-CM | POA: Insufficient documentation

## 2011-10-22 DIAGNOSIS — O352XX0 Maternal care for (suspected) hereditary disease in fetus, not applicable or unspecified: Secondary | ICD-10-CM | POA: Insufficient documentation

## 2011-10-22 DIAGNOSIS — Z8279 Family history of other congenital malformations, deformations and chromosomal abnormalities: Secondary | ICD-10-CM

## 2011-10-22 DIAGNOSIS — Z363 Encounter for antenatal screening for malformations: Secondary | ICD-10-CM | POA: Insufficient documentation

## 2011-11-21 LAB — OB RESULTS CONSOLE HIV ANTIBODY (ROUTINE TESTING): HIV: NONREACTIVE

## 2011-12-02 ENCOUNTER — Inpatient Hospital Stay (HOSPITAL_COMMUNITY)
Admission: AD | Admit: 2011-12-02 | Discharge: 2011-12-02 | Disposition: A | Discharge status: Home / Self Care | Payer: Medicaid Other | Source: Ambulatory Visit | Attending: Obstetrics | Admitting: Obstetrics

## 2011-12-02 ENCOUNTER — Encounter (HOSPITAL_COMMUNITY): Payer: Self-pay | Admitting: *Deleted

## 2011-12-02 DIAGNOSIS — O4700 False labor before 37 completed weeks of gestation, unspecified trimester: Secondary | ICD-10-CM | POA: Diagnosis present

## 2011-12-02 DIAGNOSIS — O479 False labor, unspecified: Secondary | ICD-10-CM

## 2011-12-02 DIAGNOSIS — R109 Unspecified abdominal pain: Secondary | ICD-10-CM | POA: Insufficient documentation

## 2011-12-02 LAB — URINALYSIS, ROUTINE W REFLEX MICROSCOPIC
Bilirubin Urine: NEGATIVE
Hgb urine dipstick: NEGATIVE
Ketones, ur: NEGATIVE mg/dL
Protein, ur: NEGATIVE mg/dL
Urobilinogen, UA: 0.2 mg/dL (ref 0.0–1.0)

## 2011-12-02 LAB — WET PREP, GENITAL

## 2011-12-02 NOTE — MAU Note (Signed)
Pt states she felt discharge today @ 1200, states it was watery, underwear was wet, unable to tell if clear.

## 2011-12-02 NOTE — MAU Provider Note (Signed)
Chief Complaint:  Abdominal Pain   First Provider Initiated Contact with Patient 12/02/11 1924      HPI: [name] is a 21 y.o. G3P1011 at 89w5dwho presents to maternity admissions reporting cramping/contractions.  She reports her abdominal pain is constant with some intermittent increase in pain 1-2 times/hour.  She also reports some discharge today that was thin and made a small spot in her underwear, but she has seen no leakage for the last few hours.  She reports good fetal movement, denies vaginal bleeding, vaginal itching/burning, urinary symptoms, h/a, dizziness, n/v, or fever/chills.  Her first baby was born at 62 weeks without complication.    Past Medical History: Past Medical History  Diagnosis Date  . No pertinent past medical history      Past Surgical History: Past Surgical History  Procedure Date  . No past surgeries     Family History: Family History  Problem Relation Age of Onset  . Diabetes Mother   . Hyperlipidemia Mother     Social History: History  Substance Use Topics  . Smoking status: Never Smoker   . Smokeless tobacco: Not on file  . Alcohol Use: No    Allergies:  Allergies  Allergen Reactions  . Pollen Extract Other (See Comments)    Migraines    Meds:  Prescriptions prior to admission  Medication Sig Dispense Refill  . ferrous sulfate 325 (65 FE) MG tablet Take 1 tablet (325 mg total) by mouth daily with breakfast.  30 tablet  0  . Prenatal Vit-Fe Fumarate-FA (PRENATAL MULTIVITAMIN) TABS Take 1 tablet by mouth daily.        ROS: Pertinent findings in history of present illness.  Physical Exam  Blood pressure 99/61, pulse 91, temperature 98.3 F (36.8 C), temperature source Oral, resp. rate 16, height 5' 4.5" (1.638 m), weight 76.114 kg (167 lb 12.8 oz), last menstrual period 05/22/2011, SpO2 100.00%. GENERAL: Well-developed, well-nourished female in no acute distress.  HEENT: normocephalic HEART: normal rate RESP: normal  effort ABDOMEN: Soft, non-tender, gravid appropriate for gestational age EXTREMITIES: Nontender, no edema NEURO: alert and oriented Pelvic exam: Cervix pink, visually closed, without lesion, moderate amount white creamy discharge, no pooling of fluid with cough/bearing down, vaginal walls and external genitalia normal Bimanual exam: Cervix 0/long/high, firm, anterior  Dilation: Closed Exam by:: Estill Bamberg CNM  FHT:  Baseline 145 , moderate variability, accelerations present (15x15), no decelerations Contractions: None on toco   Labs: Results for orders placed during the hospital encounter of 12/02/11 (from the past 24 hour(s))  URINALYSIS, ROUTINE W REFLEX MICROSCOPIC     Status: Normal   Collection Time   12/02/11  6:25 PM      Component Value Range   Color, Urine YELLOW  YELLOW   APPearance CLEAR  CLEAR   Specific Gravity, Urine 1.025  1.005 - 1.030   pH 6.0  5.0 - 8.0   Glucose, UA NEGATIVE  NEGATIVE mg/dL   Hgb urine dipstick NEGATIVE  NEGATIVE   Bilirubin Urine NEGATIVE  NEGATIVE   Ketones, ur NEGATIVE  NEGATIVE mg/dL   Protein, ur NEGATIVE  NEGATIVE mg/dL   Urobilinogen, UA 0.2  0.0 - 1.0 mg/dL   Nitrite NEGATIVE  NEGATIVE   Leukocytes, UA NEGATIVE  NEGATIVE  WET PREP, GENITAL     Status: Abnormal   Collection Time   12/02/11  7:35 PM      Component Value Range   Yeast Wet Prep HPF POC NONE SEEN  NONE SEEN  Trich, Wet Prep NONE SEEN  NONE SEEN   Clue Cells Wet Prep HPF POC NONE SEEN  NONE SEEN   WBC, Wet Prep HPF POC MODERATE (*) NONE SEEN     Assessment: 1. Preterm uterine contractions     Plan: Discharge home [facility] pending PTL precautions and fetal kick counts Drink plenty of fluids F/U with Dr Clearance Coots Return to MAU as needed    Medication List     As of 12/02/2011  7:39 PM    ASK your doctor about these medications         ferrous sulfate 325 (65 FE) MG tablet   Take 1 tablet (325 mg total) by mouth daily with breakfast.      prenatal  multivitamin Tabs   Take 1 tablet by mouth daily.        Sharen Counter Certified Nurse-Midwife 12/02/2011 7:39 PM

## 2011-12-02 NOTE — MAU Note (Signed)
Patient states she started having lower abdominal pain today, heavier than usual discharge but no bleeding. States has felt fetal movement, but not as much as usual.

## 2011-12-03 LAB — GC/CHLAMYDIA PROBE AMP, GENITAL
Chlamydia, DNA Probe: NEGATIVE
GC Probe Amp, Genital: NEGATIVE

## 2012-01-21 ENCOUNTER — Other Ambulatory Visit: Payer: Self-pay | Admitting: Obstetrics

## 2012-01-21 DIAGNOSIS — IMO0002 Reserved for concepts with insufficient information to code with codable children: Secondary | ICD-10-CM

## 2012-01-23 ENCOUNTER — Ambulatory Visit (HOSPITAL_COMMUNITY)
Admission: RE | Admit: 2012-01-23 | Discharge: 2012-01-23 | Disposition: A | Discharge status: Home / Self Care | Payer: Medicaid Other | Source: Ambulatory Visit | Attending: Obstetrics | Admitting: Obstetrics

## 2012-01-23 DIAGNOSIS — O358XX0 Maternal care for other (suspected) fetal abnormality and damage, not applicable or unspecified: Secondary | ICD-10-CM | POA: Insufficient documentation

## 2012-01-23 DIAGNOSIS — O365990 Maternal care for other known or suspected poor fetal growth, unspecified trimester, not applicable or unspecified: Secondary | ICD-10-CM | POA: Insufficient documentation

## 2012-01-23 DIAGNOSIS — Z1389 Encounter for screening for other disorder: Secondary | ICD-10-CM | POA: Insufficient documentation

## 2012-01-23 DIAGNOSIS — O352XX0 Maternal care for (suspected) hereditary disease in fetus, not applicable or unspecified: Secondary | ICD-10-CM | POA: Insufficient documentation

## 2012-01-23 DIAGNOSIS — IMO0002 Reserved for concepts with insufficient information to code with codable children: Secondary | ICD-10-CM

## 2012-01-23 DIAGNOSIS — Z363 Encounter for antenatal screening for malformations: Secondary | ICD-10-CM | POA: Insufficient documentation

## 2012-02-05 NOTE — L&D Delivery Note (Signed)
Delivery Note At 9:17 PM a viable female was delivered via Vaginal, Spontaneous Delivery (Presentation: Left Occiput Anterior).  APGAR: 9, 9; weight .   Placenta status: Intact, Spontaneous.  Cord: 3 vessels with the following complications: None.  Cord pH: not done  Anesthesia: Local  Episiotomy: None Lacerations: 1st degree Suture Repair: 2.0 vicryl Est. Blood Loss (mL): 300  Mom to postpartum.  Baby to nursery-stable.  Jafet Wissing A 03/03/2012, 9:32 PM

## 2012-03-03 ENCOUNTER — Inpatient Hospital Stay (HOSPITAL_COMMUNITY)
Admission: AD | Admit: 2012-03-03 | Discharge: 2012-03-05 | DRG: 775 | Disposition: A | Discharge status: Home / Self Care | Payer: Medicaid Other | Source: Ambulatory Visit | Attending: Obstetrics | Admitting: Obstetrics

## 2012-03-03 ENCOUNTER — Encounter (HOSPITAL_COMMUNITY): Payer: Self-pay

## 2012-03-03 DIAGNOSIS — O4700 False labor before 37 completed weeks of gestation, unspecified trimester: Secondary | ICD-10-CM

## 2012-03-03 DIAGNOSIS — O479 False labor, unspecified: Secondary | ICD-10-CM

## 2012-03-03 LAB — CBC
HCT: 34.4 % — ABNORMAL LOW (ref 36.0–46.0)
Hemoglobin: 11.5 g/dL — ABNORMAL LOW (ref 12.0–15.0)
MCH: 27.3 pg (ref 26.0–34.0)
MCHC: 33.4 g/dL (ref 30.0–36.0)
MCV: 81.5 fL (ref 78.0–100.0)
Platelets: 252 K/uL (ref 150–400)
RBC: 4.22 MIL/uL (ref 3.87–5.11)
RDW: 14.6 % (ref 11.5–15.5)
WBC: 13.5 K/uL — ABNORMAL HIGH (ref 4.0–10.5)

## 2012-03-03 MED ORDER — LIDOCAINE HCL (PF) 1 % IJ SOLN
30.0000 mL | INTRAMUSCULAR | Status: DC | PRN
  Administered 2012-03-03: 30 mL via SUBCUTANEOUS

## 2012-03-03 MED ORDER — OXYCODONE-ACETAMINOPHEN 5-325 MG PO TABS
1.0000 | ORAL_TABLET | ORAL | Status: DC | PRN
  Administered 2012-03-03: 2 via ORAL
  Administered 2012-03-05: 1 via ORAL
  Filled 2012-03-03: qty 2
  Filled 2012-03-03: qty 1

## 2012-03-03 MED ORDER — LACTATED RINGERS IV SOLN: 500.0000 mL | INTRAVENOUS | Status: DC | PRN

## 2012-03-03 MED ORDER — OXYTOCIN BOLUS FROM INFUSION
500.0000 mL | INTRAVENOUS | Status: DC
  Administered 2012-03-03: 500 mL via INTRAVENOUS

## 2012-03-03 MED ORDER — OXYTOCIN 40 UNITS IN LACTATED RINGERS INFUSION - SIMPLE MED
INTRAVENOUS | Status: AC
  Filled 2012-03-03: qty 1000

## 2012-03-03 MED ORDER — FLEET ENEMA 7-19 GM/118ML RE ENEM: 1.0000 | ENEMA | RECTAL | Status: DC | PRN

## 2012-03-03 MED ORDER — HYDROXYZINE HCL 50 MG PO TABS: 50.0000 mg | ORAL_TABLET | Freq: Four times a day (QID) | ORAL | Status: DC | PRN

## 2012-03-03 MED ORDER — LACTATED RINGERS IV SOLN
INTRAVENOUS | Status: DC
  Administered 2012-03-03: 21:00:00 via INTRAVENOUS

## 2012-03-03 MED ORDER — LIDOCAINE HCL (PF) 1 % IJ SOLN
INTRAMUSCULAR | Status: AC
  Administered 2012-03-03: 30 mL via SUBCUTANEOUS
  Filled 2012-03-03: qty 30

## 2012-03-03 MED ORDER — HYDROXYZINE HCL 50 MG/ML IM SOLN: 50.0000 mg | Freq: Four times a day (QID) | INTRAMUSCULAR | Status: DC | PRN

## 2012-03-03 MED ORDER — IBUPROFEN 600 MG PO TABS
600.0000 mg | ORAL_TABLET | Freq: Four times a day (QID) | ORAL | Status: DC | PRN
  Administered 2012-03-03: 600 mg via ORAL
  Filled 2012-03-03 (×3): qty 1

## 2012-03-03 MED ORDER — ACETAMINOPHEN 325 MG PO TABS: 650.0000 mg | ORAL_TABLET | ORAL | Status: DC | PRN

## 2012-03-03 MED ORDER — BUTORPHANOL TARTRATE 1 MG/ML IJ SOLN
1.0000 mg | Freq: Once | INTRAMUSCULAR | Status: DC
Start: 2012-03-03 — End: 2012-03-04

## 2012-03-03 MED ORDER — CITRIC ACID-SODIUM CITRATE 334-500 MG/5ML PO SOLN: 30.0000 mL | ORAL | Status: DC | PRN

## 2012-03-03 MED ORDER — ONDANSETRON HCL 4 MG/2ML IJ SOLN: 4.0000 mg | Freq: Four times a day (QID) | INTRAMUSCULAR | Status: DC | PRN

## 2012-03-03 MED ORDER — OXYTOCIN 40 UNITS IN LACTATED RINGERS INFUSION - SIMPLE MED: 62.5000 mL/h | INTRAVENOUS | Status: DC

## 2012-03-03 NOTE — H&P (Signed)
This is Dr. Francoise Ceo dictating the history and physical on  [name] she's a 22 year old gravida 3 para 1011 at 73 weeks and 6 days [facility] 03/04/2012 admitted in labor 9 cm dilated membranes ruptured rapid progress normal vaginal delivery of a female Apgar 17 she had a first-degree perineal which was repaired with #1 Vicryl placenta was spontaneous and she had a negative GBS Past medical history negative Past surgical history negative Social history negative System review noncontributory Physical exam revealed a well-developed female in no distress HEENT negative Heart regular rhythm no murmurs no gallops Breasts negative Abdomen uterus 20 week postpartum Pelvic as described above Extremities negative

## 2012-03-03 NOTE — MAU Note (Signed)
Pt received to MAU rm#6-she is restless and moaning-SVE done-8cm-100%-1 station- FHT 145

## 2012-03-04 ENCOUNTER — Encounter (HOSPITAL_COMMUNITY): Payer: Self-pay | Admitting: *Deleted

## 2012-03-04 LAB — CBC
HCT: 32.1 % — ABNORMAL LOW (ref 36.0–46.0)
MCH: 26.8 pg (ref 26.0–34.0)
MCV: 81.3 fL (ref 78.0–100.0)
RBC: 3.95 MIL/uL (ref 3.87–5.11)
RDW: 14.6 % (ref 11.5–15.5)
WBC: 17.1 10*3/uL — ABNORMAL HIGH (ref 4.0–10.5)

## 2012-03-04 MED ORDER — IBUPROFEN 600 MG PO TABS
600.0000 mg | ORAL_TABLET | Freq: Four times a day (QID) | ORAL | Status: DC
  Administered 2012-03-04 – 2012-03-05 (×5): 600 mg via ORAL
  Filled 2012-03-04 (×3): qty 1

## 2012-03-04 MED ORDER — BENZOCAINE-MENTHOL 20-0.5 % EX AERO: 1.0000 "application " | INHALATION_SPRAY | CUTANEOUS | Status: DC | PRN

## 2012-03-04 MED ORDER — WITCH HAZEL-GLYCERIN EX PADS: 1.0000 "application " | MEDICATED_PAD | CUTANEOUS | Status: DC | PRN

## 2012-03-04 MED ORDER — TETANUS-DIPHTH-ACELL PERTUSSIS 5-2.5-18.5 LF-MCG/0.5 IM SUSP
0.5000 mL | Freq: Once | INTRAMUSCULAR | Status: AC
  Administered 2012-03-05: 0.5 mL via INTRAMUSCULAR
  Filled 2012-03-04: qty 0.5

## 2012-03-04 MED ORDER — SIMETHICONE 80 MG PO CHEW: 80.0000 mg | CHEWABLE_TABLET | ORAL | Status: DC | PRN

## 2012-03-04 MED ORDER — FERROUS SULFATE 325 (65 FE) MG PO TABS
325.0000 mg | ORAL_TABLET | Freq: Two times a day (BID) | ORAL | Status: DC
  Administered 2012-03-04 – 2012-03-05 (×3): 325 mg via ORAL
  Filled 2012-03-04 (×3): qty 1

## 2012-03-04 MED ORDER — DIPHENHYDRAMINE HCL 25 MG PO CAPS: 25.0000 mg | ORAL_CAPSULE | Freq: Four times a day (QID) | ORAL | Status: DC | PRN

## 2012-03-04 MED ORDER — LANOLIN HYDROUS EX OINT: TOPICAL_OINTMENT | CUTANEOUS | Status: DC | PRN

## 2012-03-04 MED ORDER — PRENATAL MULTIVITAMIN CH
1.0000 | ORAL_TABLET | Freq: Every day | ORAL | Status: DC
  Administered 2012-03-04 – 2012-03-05 (×2): 1 via ORAL
  Filled 2012-03-04 (×2): qty 1

## 2012-03-04 MED ORDER — DIBUCAINE 1 % RE OINT: 1.0000 "application " | TOPICAL_OINTMENT | RECTAL | Status: DC | PRN

## 2012-03-04 MED ORDER — ONDANSETRON HCL 4 MG PO TABS: 4.0000 mg | ORAL_TABLET | ORAL | Status: DC | PRN

## 2012-03-04 MED ORDER — ZOLPIDEM TARTRATE 5 MG PO TABS: 5.0000 mg | ORAL_TABLET | Freq: Every evening | ORAL | Status: DC | PRN

## 2012-03-04 MED ORDER — SENNOSIDES-DOCUSATE SODIUM 8.6-50 MG PO TABS
2.0000 | ORAL_TABLET | Freq: Every day | ORAL | Status: DC
  Administered 2012-03-04: 2 via ORAL

## 2012-03-04 MED ORDER — ONDANSETRON HCL 4 MG/2ML IJ SOLN: 4.0000 mg | INTRAMUSCULAR | Status: DC | PRN

## 2012-03-04 MED ORDER — OXYCODONE-ACETAMINOPHEN 5-325 MG PO TABS
1.0000 | ORAL_TABLET | ORAL | Status: DC | PRN
  Administered 2012-03-04: 1 via ORAL
  Filled 2012-03-04: qty 1

## 2012-03-04 NOTE — Progress Notes (Signed)
UR chart review completed.  

## 2012-03-04 NOTE — Progress Notes (Signed)
Post Partum Day 1 Subjective: no complaints  Objective: Blood pressure 107/67, pulse 71, temperature 97.9 F (36.6 C), temperature source Oral, resp. rate 18, last menstrual period 05/22/2011, SpO2 97.00%, unknown if currently breastfeeding.  Physical Exam:  General: alert and no distress Lochia: appropriate Uterine Fundus: firm Incision: healing well DVT Evaluation: No evidence of DVT seen on physical exam.   Basename 03/04/12 0508 03/03/12 2100  HGB 10.6* 11.5*  HCT 32.1* 34.4*    Assessment/Plan: Plan for discharge tomorrow   LOS: 1 day   HARPER,CHARLES A 03/04/2012, 8:46 AM

## 2012-03-05 MED ORDER — IBUPROFEN 600 MG PO TABS: 600.0000 mg | ORAL_TABLET | Freq: Four times a day (QID) | ORAL | Status: DC | PRN

## 2012-03-05 MED ORDER — OXYCODONE-ACETAMINOPHEN 5-325 MG PO TABS: 1.0000 | ORAL_TABLET | ORAL | Status: DC | PRN

## 2012-03-05 NOTE — Progress Notes (Signed)
Post Partum Day 2 Subjective: no complaints  Objective: Blood pressure 110/70, pulse 71, temperature 98.7 F (37.1 C), temperature source Oral, resp. rate 18, last menstrual period 05/22/2011, SpO2 97.00%, unknown if currently breastfeeding.  Physical Exam:  General: alert and no distress Lochia: appropriate Uterine Fundus: firm Incision: healing well DVT Evaluation: No evidence of DVT seen on physical exam.   Basename 03/04/12 0508 03/03/12 2100  HGB 10.6* 11.5*  HCT 32.1* 34.4*    Assessment/Plan: Discharge home   LOS: 2 days   [name] A 03/05/2012, 4:53 AM

## 2012-03-05 NOTE — Discharge Summary (Signed)
Obstetric Discharge Summary Reason for Admission: onset of labor Prenatal Procedures: none Intrapartum Procedures: spontaneous vaginal delivery Postpartum Procedures: none Complications-Operative and Postpartum: none Hemoglobin  Date Value Range Status  03/04/2012 10.6* 12.0 - 15.0 g/dL Final     HCT  Date Value Range Status  03/04/2012 32.1* 36.0 - 46.0 % Final    Physical Exam:  General: alert and no distress Lochia: appropriate Uterine Fundus: firm Incision: healing well DVT Evaluation: No evidence of DVT seen on physical exam.  Discharge Diagnoses: Term Pregnancy-delivered  Discharge Information: Date: 03/05/2012 Activity: pelvic rest Diet: routine Medications: PNV, Ibuprofen, Colace and Percocet Condition: stable Instructions: refer to practice specific booklet Discharge to: home Follow-up Information    Follow up with Nakyiah Kuck A, MD. Schedule an appointment as soon as possible for a visit in 2 weeks.   Contact information:   [address] [place] [address] [phone]          Newborn Data: Live born female  Birth Weight: 7 lb 2 oz (3232 g) APGAR: 9, 9  Home with mother.  Earnesteen Birnie A 03/05/2012, 6:16 AM

## 2012-03-10 ENCOUNTER — Inpatient Hospital Stay (HOSPITAL_COMMUNITY): Admission: RE | Admit: 2012-03-10 | Payer: Medicaid Other | Source: Ambulatory Visit

## 2012-09-01 ENCOUNTER — Ambulatory Visit: Payer: Self-pay | Admitting: Obstetrics

## 2013-12-06 ENCOUNTER — Encounter (HOSPITAL_COMMUNITY): Payer: Self-pay | Admitting: *Deleted

## 2014-01-12 ENCOUNTER — Inpatient Hospital Stay (HOSPITAL_COMMUNITY): Payer: Medicaid Other

## 2014-01-12 ENCOUNTER — Inpatient Hospital Stay (HOSPITAL_COMMUNITY)
Admission: AD | Admit: 2014-01-12 | Discharge: 2014-01-12 | Disposition: A | Discharge status: Home / Self Care | Payer: Medicaid Other | Source: Ambulatory Visit | Attending: Obstetrics & Gynecology | Admitting: Obstetrics & Gynecology

## 2014-01-12 ENCOUNTER — Encounter (HOSPITAL_COMMUNITY): Payer: Self-pay | Admitting: General Practice

## 2014-01-12 DIAGNOSIS — Z3A08 8 weeks gestation of pregnancy: Secondary | ICD-10-CM | POA: Insufficient documentation

## 2014-01-12 DIAGNOSIS — R109 Unspecified abdominal pain: Secondary | ICD-10-CM | POA: Diagnosis present

## 2014-01-12 DIAGNOSIS — O200 Threatened abortion: Secondary | ICD-10-CM | POA: Diagnosis not present

## 2014-01-12 DIAGNOSIS — O209 Hemorrhage in early pregnancy, unspecified: Secondary | ICD-10-CM

## 2014-01-12 LAB — WET PREP, GENITAL
Clue Cells Wet Prep HPF POC: NONE SEEN
Trich, Wet Prep: NONE SEEN
YEAST WET PREP: NONE SEEN

## 2014-01-12 LAB — CBC
HEMATOCRIT: 35.8 % — AB (ref 36.0–46.0)
HEMOGLOBIN: 11.9 g/dL — AB (ref 12.0–15.0)
MCH: 26.3 pg (ref 26.0–34.0)
MCHC: 33.2 g/dL (ref 30.0–36.0)
MCV: 79 fL (ref 78.0–100.0)
PLATELETS: 289 10*3/uL (ref 150–400)
RBC: 4.53 MIL/uL (ref 3.87–5.11)
RDW: 13.6 % (ref 11.5–15.5)
WBC: 6.5 10*3/uL (ref 4.0–10.5)

## 2014-01-12 LAB — URINALYSIS, ROUTINE W REFLEX MICROSCOPIC
Bilirubin Urine: NEGATIVE
GLUCOSE, UA: NEGATIVE mg/dL
Hgb urine dipstick: NEGATIVE
Ketones, ur: NEGATIVE mg/dL
LEUKOCYTES UA: NEGATIVE
NITRITE: NEGATIVE
PROTEIN: NEGATIVE mg/dL
Specific Gravity, Urine: 1.02 (ref 1.005–1.030)
UROBILINOGEN UA: 1 mg/dL (ref 0.0–1.0)
pH: 6 (ref 5.0–8.0)

## 2014-01-12 LAB — POCT PREGNANCY, URINE: PREG TEST UR: POSITIVE — AB

## 2014-01-12 LAB — HIV ANTIBODY (ROUTINE TESTING W REFLEX): HIV: NONREACTIVE

## 2014-01-12 LAB — HCG, QUANTITATIVE, PREGNANCY: hCG, Beta Chain, Quant, S: 12957 m[IU]/mL — ABNORMAL HIGH (ref <5)

## 2014-01-12 NOTE — Discharge Instructions (Signed)
Threatened Miscarriage °A threatened miscarriage occurs when you have vaginal bleeding during your first 20 weeks of pregnancy but the pregnancy has not ended. If you have vaginal bleeding during this time, your health care provider will do tests to make sure you are still pregnant. If the tests show you are still pregnant and the developing baby (fetus) inside your womb (uterus) is still growing, your condition is considered a threatened miscarriage. °A threatened miscarriage does not mean your pregnancy will end, but it does increase the risk of losing your pregnancy (complete miscarriage). °CAUSES  °The cause of a threatened miscarriage is usually not known. If you go on to have a complete miscarriage, the most common cause is an abnormal number of chromosomes in the developing baby. Chromosomes are the structures inside cells that hold all your genetic material. °Some causes of vaginal bleeding that do not result in miscarriage include: °· Having sex. °· Having an infection. °· Normal hormone changes of pregnancy. °· Bleeding that occurs when an egg implants in your uterus. °RISK FACTORS °Risk factors for bleeding in early pregnancy include: °· Obesity. °· Smoking. °· Drinking excessive amounts of alcohol or caffeine. °· Recreational drug use. °SIGNS AND SYMPTOMS °· Light vaginal bleeding. °· Mild abdominal pain or cramps. °DIAGNOSIS  °If you have bleeding with or without abdominal pain before 20 weeks of pregnancy, your health care provider will do tests to check whether you are still pregnant. One important test involves using sound waves and a computer (ultrasound) to create images of the inside of your uterus. Other tests include an internal exam of your vagina and uterus (pelvic exam) and measurement of your baby's heart rate.  °You may be diagnosed with a threatened miscarriage if: °· Ultrasound testing shows you are still pregnant. °· Your baby's heart rate is strong. °· A pelvic exam shows that the  opening between your uterus and your vagina (cervix) is closed. °· Your heart rate and blood pressure are stable. °· Blood tests confirm you are still pregnant. °TREATMENT  °No treatments have been shown to prevent a threatened miscarriage from going on to a complete miscarriage. However, the right home care is important.  °HOME CARE INSTRUCTIONS  °· Make sure you keep all your appointments for prenatal care. This is very important. °· Get plenty of rest. °· Do not have sex or use tampons if you have vaginal bleeding. °· Do not douche. °· Do not smoke or use recreational drugs. °· Do not drink alcohol. °· Avoid caffeine. °SEEK MEDICAL CARE IF: °· You have light vaginal bleeding or spotting while pregnant. °· You have abdominal pain or cramping. °· You have a fever. °SEEK IMMEDIATE MEDICAL CARE IF: °· You have heavy vaginal bleeding. °· You have blood clots coming from your vagina. °· You have severe low back pain or abdominal cramps. °· You have fever, chills, and severe abdominal pain. °MAKE SURE YOU: °· Understand these instructions. °· Will watch your condition. °· Will get help right away if you are not doing well or get worse. °Document Released: 01/21/2005 Document Revised: 01/26/2013 Document Reviewed: 11/17/2012 °ExitCare® Patient Information ©2015 [employer]. This information is not intended to replace advice given to you by your health care provider. Make sure you discuss any questions you have with your health care provider. ° °Vaginal Bleeding During Pregnancy, First Trimester °A small amount of bleeding (spotting) from the vagina is relatively common in early pregnancy. It usually stops on its own. Various things may cause bleeding   or spotting in early pregnancy. Some bleeding may be related to the pregnancy, and some may not. In most cases, the bleeding is normal and is not a problem. However, bleeding can also be a sign of something serious. Be sure to tell your health care provider about any  vaginal bleeding right away. °Some possible causes of vaginal bleeding during the first trimester include: °· Infection or inflammation of the cervix. °· Growths (polyps) on the cervix. °· Miscarriage or threatened miscarriage. °· Pregnancy tissue has developed outside of the uterus and in a fallopian tube (tubal pregnancy). °· Tiny cysts have developed in the uterus instead of pregnancy tissue (molar pregnancy). °HOME CARE INSTRUCTIONS  °Watch your condition for any changes. The following actions may help to lessen any discomfort you are feeling: °· Follow your health care provider's instructions for limiting your activity. If your health care provider orders bed rest, you may need to stay in bed and only get up to use the bathroom. However, your health care provider may allow you to continue light activity. °· If needed, make plans for someone to help with your regular activities and responsibilities while you are on bed rest. °· Keep track of the number of pads you use each day, how often you change pads, and how soaked (saturated) they are. Write this down. °· Do not use tampons. Do not douche. °· Do not have sexual intercourse or orgasms until approved by your health care provider. °· If you pass any tissue from your vagina, save the tissue so you can show it to your health care provider. °· Only take over-the-counter or prescription medicines as directed by your health care provider. °· Do not take aspirin because it can make you bleed. °· Keep all follow-up appointments as directed by your health care provider. °SEEK MEDICAL CARE IF: °· You have any vaginal bleeding during any part of your pregnancy. °· You have cramps or labor pains. °· You have a fever, not controlled by medicine. °SEEK IMMEDIATE MEDICAL CARE IF:  °· You have severe cramps in your back or belly (abdomen). °· You pass large clots or tissue from your vagina. °· Your bleeding increases. °· You feel light-headed or weak, or you have fainting  episodes. °· You have chills. °· You are leaking fluid or have a gush of fluid from your vagina. °· You pass out while having a bowel movement. °MAKE SURE YOU: °· Understand these instructions. °· Will watch your condition. °· Will get help right away if you are not doing well or get worse. °Document Released: 10/31/2004 Document Revised: 01/26/2013 Document Reviewed: 09/28/2012 °ExitCare® Patient Information ©2015 [employer]. This information is not intended to replace advice given to you by your health care provider. Make sure you discuss any questions you have with your health care provider. ° °Pelvic Rest °Pelvic rest is sometimes recommended for women when:  °· The placenta is partially or completely covering the opening of the cervix (placenta previa). °· There is bleeding between the uterine wall and the amniotic sac in the first trimester (subchorionic hemorrhage). °· The cervix begins to open without labor starting (incompetent cervix, cervical insufficiency). °· The labor is too early (preterm labor). °HOME CARE INSTRUCTIONS °· Do not have sexual intercourse, stimulation, or an orgasm. °· Do not use tampons, douche, or put anything in the vagina. °· Do not lift anything over 10 pounds (4.5 kg). °· Avoid strenuous activity or straining your pelvic muscles. °SEEK MEDICAL CARE IF:  °· You have   any vaginal bleeding during pregnancy. Treat this as a potential emergency. °· You have cramping pain felt low in the stomach (stronger than menstrual cramps). °· You notice vaginal discharge (watery, mucus, or bloody). °· You have a low, dull backache. °· There are regular contractions or uterine tightening. °SEEK IMMEDIATE MEDICAL CARE IF: °You have vaginal bleeding and have placenta previa.  °Document Released: 05/18/2010 Document Revised: 04/15/2011 Document Reviewed: 05/18/2010 °ExitCare® Patient Information ©2015 [employer]. This information is not intended to replace advice given to you by your health care  provider. Make sure you discuss any questions you have with your health care provider. ° °

## 2014-01-12 NOTE — MAU Provider Note (Signed)
History     CSN: [id number]  Arrival date and time: 01/12/14 7829   First Provider Initiated Contact with Patient 01/12/14 1013      Chief Complaint  Patient presents with  . Abdominal Pain  . Vaginal Bleeding   Abdominal Pain Pertinent negatives include no diarrhea, dysuria, fever, frequency, headaches, nausea or vomiting.  Vaginal Bleeding Associated symptoms include abdominal pain and back pain (mild tightening sensation in upper back). Pertinent negatives include no chills, diarrhea, dysuria, fever, frequency, headaches, nausea, urgency or vomiting.   [name] is a 23 yo, [id number], LMP 11/15/13, presenting to the MAU today for 6 day history of vaginal bleeding and cramping. On day 1 of symptoms, the bleeding was described as red, and did not saturate more than 1 panty liner. The following 5 days, the bleeding has been more brown with pink streaks, also never bleeding through more than 1 liner. She denies seeing any blood clots or tissue. The abdominal cramping is described as being similar to a menstrual cramp and mild-mod in intensity. She also c/o upper thoracic back pain which feels tight and similar to the back pain she usually feels preceding menstruation. No N/V/D, or urinary changes.    Past Medical History  Diagnosis Date  . No pertinent past medical history   . Medical history non-contributory     Past Surgical History  Procedure Laterality Date  . No past surgeries      Family History  Problem Relation Age of Onset  . Diabetes Mother   . Hyperlipidemia Mother     History  Substance Use Topics  . Smoking status: Never Smoker   . Smokeless tobacco: Not on file  . Alcohol Use: No    Allergies:  Allergies  Allergen Reactions  . Pollen Extract Other (See Comments)    Migraines    Prescriptions prior to admission  Medication Sig Dispense Refill Last Dose  . ferrous sulfate 325 (65 FE) MG tablet Take 1 tablet (325 mg total) by mouth daily with breakfast.  30 tablet 0 Past Week at Unknown  . ibuprofen (ADVIL,MOTRIN) 600 MG tablet Take 1 tablet (600 mg total) by mouth every 6 (six) hours as needed for pain. 30 tablet 5   . oxyCODONE-acetaminophen (PERCOCET/ROXICET) 5-325 MG per tablet Take 1-2 tablets by mouth every 4 (four) hours as needed for pain (for pain scale > 4). 40 tablet 0   . Prenatal Vit-Fe Fumarate-FA (PRENATAL MULTIVITAMIN) TABS Take 1 tablet by mouth daily.   Past Week at Unknown   Results for orders placed or performed during the hospital encounter of 01/12/14 (from the past 48 hour(s))  Urinalysis, Routine w reflex microscopic     Status: None   Collection Time: 01/12/14 10:06 AM  Result Value Ref Range   Color, Urine YELLOW YELLOW   APPearance CLEAR CLEAR   Specific Gravity, Urine 1.020 1.005 - 1.030   pH 6.0 5.0 - 8.0   Glucose, UA NEGATIVE NEGATIVE mg/dL   Hgb urine dipstick NEGATIVE NEGATIVE   Bilirubin Urine NEGATIVE NEGATIVE   Ketones, ur NEGATIVE NEGATIVE mg/dL   Protein, ur NEGATIVE NEGATIVE mg/dL   Urobilinogen, UA 1.0 0.0 - 1.0 mg/dL   Nitrite NEGATIVE NEGATIVE   Leukocytes, UA NEGATIVE NEGATIVE    Comment: MICROSCOPIC NOT DONE ON URINES WITH NEGATIVE PROTEIN, BLOOD, LEUKOCYTES, NITRITE, OR GLUCOSE <1000 mg/dL.  Pregnancy, urine POC     Status: Abnormal   Collection Time: 01/12/14 10:07 AM  Result Value Ref Range  Preg Test, Ur POSITIVE (A) NEGATIVE    Comment:        THE SENSITIVITY OF THIS METHODOLOGY IS >24 mIU/mL   Wet prep, genital     Status: Abnormal   Collection Time: 01/12/14 10:45 AM  Result Value Ref Range   Yeast Wet Prep HPF POC NONE SEEN NONE SEEN   Trich, Wet Prep NONE SEEN NONE SEEN   Clue Cells Wet Prep HPF POC NONE SEEN NONE SEEN   WBC, Wet Prep HPF POC FEW (A) NONE SEEN    Comment: FEW BACTERIA SEEN  CBC     Status: Abnormal   Collection Time: 01/12/14 10:50 AM  Result Value Ref Range   WBC 6.5 4.0 - 10.5 K/uL   RBC 4.53 3.87 - 5.11 MIL/uL   Hemoglobin 11.9 (L) 12.0 - 15.0 g/dL    HCT 69.635.8 (L) 29.536.0 - 46.0 %   MCV 79.0 78.0 - 100.0 fL   MCH 26.3 26.0 - 34.0 pg   MCHC 33.2 30.0 - 36.0 g/dL   RDW 28.413.6 13.211.5 - 44.015.5 %   Platelets 289 150 - 400 K/uL  hCG, quantitative, pregnancy     Status: Abnormal   Collection Time: 01/12/14 10:50 AM  Result Value Ref Range   hCG, Beta Chain, [name], S 12957 (H) <5 mIU/mL    Comment:          GEST. AGE      CONC.  (mIU/mL)   <=1 WEEK        5 - 50     2 WEEKS       50 - 500     3 WEEKS       100 - 10,000     4 WEEKS     1,000 - 30,000     5 WEEKS     3,500 - 115,000   6-8 WEEKS     12,000 - 270,000    12 WEEKS     15,000 - 220,000        FEMALE AND NON-PREGNANT FEMALE:     LESS THAN 5 mIU/mL     [place] Ob Comp Less 14 Wks  01/12/2014   CLINICAL DATA:  Bleeding and cramping.  EXAM: OBSTETRIC <14 WK US AND TRANSVAGINAL OB US  TECHNIQUE: Both transabdominal and transvaginal ultrasound examinations were performed for complete evaluation of the gestation as well as the maternal uterus, adnexal regions, and pelvic cul-de-sac. Transvaginal technique was performed to assess early pregnancy.  COMPARISON:  None.  FINDINGS: Intrauterine gestational sac: There are irregularly configured intrauterine fluid collections. The largest measures 1 cm. None a these contain a definite yolk sac or embryo.  Yolk sac:  No  Embryo:  No  Cardiac Activity: Not applicable  MSD:  Not measurable.  Maternal uterus/adnexae:  Subchorionic hemorrhage: None  Right ovary: Normal  Left ovary: Normal  Other :None  Free fluid:  None  IMPRESSION: 1. There are several irregular the shaped fluid intrauterine fluid collections. The appearance is nonspecific. In the setting of a elevated beta HCG Differential considerations include intrauterine pregnancy early pregnancy complicated by subchorionic hemorrhage, abortion in progress, missed abortion with retained products, or less likely ectopic pregnancy. Followup ultrasound and serial quantitative beta HCG is recommended in 10-14 days for  further evaluation.   Electronically Signed   By: Signa Kellaylor  Stroud M.D.   On: 01/12/2014 12:55   [place] Ob Transvaginal  01/12/2014   CLINICAL DATA:  Bleeding and cramping.  EXAM: OBSTETRIC <14 WK  US AND TRANSVAGINAL OB US  TECHNIQUE: Both transabdominal and transvaginal ultrasound examinations were performed for complete evaluation of the gestation as well as the maternal uterus, adnexal regions, and pelvic cul-de-sac. Transvaginal technique was performed to assess early pregnancy.  COMPARISON:  None.  FINDINGS: Intrauterine gestational sac: There are irregularly configured intrauterine fluid collections. The largest measures 1 cm. None a these contain a definite yolk sac or embryo.  Yolk sac:  No  Embryo:  No  Cardiac Activity: Not applicable  MSD:  Not measurable.  Maternal uterus/adnexae:  Subchorionic hemorrhage: None  Right ovary: Normal  Left ovary: Normal  Other :None  Free fluid:  None  IMPRESSION: 1. There are several irregular the shaped fluid intrauterine fluid collections. The appearance is nonspecific. In the setting of a elevated beta HCG Differential considerations include intrauterine pregnancy early pregnancy complicated by subchorionic hemorrhage, abortion in progress, missed abortion with retained products, or less likely ectopic pregnancy. Followup ultrasound and serial quantitative beta HCG is recommended in 10-14 days for further evaluation.   Electronically Signed   By: Signa Kellaylor  Stroud M.D.   On: 01/12/2014 12:55    Review of Systems  Constitutional: Negative for fever and chills.  Eyes: Negative for blurred vision and double vision.  Gastrointestinal: Positive for abdominal pain. Negative for nausea, vomiting and diarrhea.  Genitourinary: Positive for vaginal bleeding. Negative for dysuria, urgency and frequency.  Musculoskeletal: Positive for back pain (mild tightening sensation in upper back).  Neurological: Negative for headaches.   Physical Exam   Blood pressure 117/55, pulse  72, temperature 98.1 F (36.7 C), temperature source Oral, resp. rate 18, height 5\' 4"  (1.626 m), weight 82.555 kg (182 lb), last menstrual period 11/15/2013, SpO2 100 %, not currently breastfeeding.  Physical Exam  Constitutional: She is oriented to person, place, and time. She appears well-developed and well-nourished. No distress.  HENT:  Head: Normocephalic and atraumatic.  Cardiovascular: Normal rate and regular rhythm.   GI: Soft. She exhibits no distension. There is tenderness (mild tenderness to deep palpation in the epigastric, lower right and left abdominal quadrants).  Musculoskeletal: Normal range of motion.  Neurological: She is alert and oriented to person, place, and time.  Skin: Skin is warm and dry. She is not diaphoretic.  Psychiatric: Her behavior is normal.   Pelvic exam: Cervix pink, visually closed, without lesion, scant white creamy discharge, vaginal walls and external genitalia normal  Bimanual exam: Cervix 0/long/high, firm, anterior, neg CMT, uterus nontender, nonenlarged, adnexa without tenderness, enlargement, or mass   MAU Course  Procedures  None  MDM -Pelvic exam -CBC -Ultrasound -Beta [name] -GC/Chlamydia  -B positive blood type   Assessment and Plan    A:  1. Threatened miscarriage   2. Vaginal bleeding before [redacted] weeks gestation   3.     Pregnancy of unknown location   P:  Discharge home in stable condition Return to MAU in 48 hours for [name] (patient unable to go to clinic due to work schedule) or sooner if symptoms worsen  Ectopic precautions Pelvic rest Bleeding precautions   Knox RoyaltyRamirez, Nicholas D, Student-PA   01/12/2014, 10:52 AM    Evaluation and management procedures were performed by the PA student under my supervision and collaboration. I have reviewed the note and chart, and I agree with the management and plan.  Iona HansenJennifer Irene Toryn Mcclinton, NP 01/12/2014 2:23 PM

## 2014-01-12 NOTE — MAU Note (Signed)
Pt C/O abd cramping & bleeding since last Friday, Has been getting worse, also back pain. Pos HPT 3 weeks ago.

## 2014-01-13 LAB — GC/CHLAMYDIA PROBE AMP
CT Probe RNA: NEGATIVE
GC Probe RNA: NEGATIVE

## 2014-01-14 ENCOUNTER — Inpatient Hospital Stay (HOSPITAL_COMMUNITY)
Admission: AD | Admit: 2014-01-14 | Discharge: 2014-01-14 | Disposition: A | Discharge status: Home / Self Care | Payer: Medicaid Other | Source: Ambulatory Visit | Attending: Obstetrics and Gynecology | Admitting: Obstetrics and Gynecology

## 2014-01-14 DIAGNOSIS — O039 Complete or unspecified spontaneous abortion without complication: Secondary | ICD-10-CM

## 2014-01-14 DIAGNOSIS — O200 Threatened abortion: Secondary | ICD-10-CM | POA: Insufficient documentation

## 2014-01-14 DIAGNOSIS — Z3A08 8 weeks gestation of pregnancy: Secondary | ICD-10-CM | POA: Diagnosis not present

## 2014-01-14 LAB — HCG, QUANTITATIVE, PREGNANCY: hCG, Beta Chain, Quant, S: 16061 m[IU]/mL — ABNORMAL HIGH (ref <5)

## 2014-01-14 NOTE — Discharge Instructions (Signed)
Miscarriage °A miscarriage is the loss of an unborn baby (fetus) before the 20th week of pregnancy. The cause is often unknown.  °HOME CARE °· You may need to stay in bed (bed rest), or you may be able to do light activity. Go about activity as told by your doctor. °· Have help at home. °· Write down how many pads you use each day. Write down how soaked they are. °· Do not use tampons. Do not wash out your vagina (douche) or have sex (intercourse) until your doctor approves. °· Only take medicine as told by your doctor. °· Do not take aspirin. °· Keep all doctor visits as told. °· If you or your partner have problems with grieving, talk to your doctor. You can also try counseling. Give yourself time to grieve before trying to get pregnant again. °GET HELP RIGHT AWAY IF: °· You have bad cramps or pain in your back or belly (abdomen). °· You have a fever. °· You pass large clumps of blood (clots) from your vagina that are walnut-sized or larger. Save the clumps for your doctor to see. °· You pass large amounts of tissue from your vagina. Save the tissue for your doctor to see. °· You have more bleeding. °· You have thick, bad-smelling fluid (discharge) coming from the vagina. °· You get lightheaded, weak, or you pass out (faint). °· You have chills. °MAKE SURE YOU: °· Understand these instructions. °· Will watch your condition. °· Will get help right away if you are not doing well or get worse. °Document Released: 04/15/2011 Document Reviewed: 04/15/2011 °ExitCare® Patient Information ©2015 [employer]. This information is not intended to replace advice given to you by your health care provider. Make sure you discuss any questions you have with your health care provider. ° °

## 2014-01-14 NOTE — MAU Note (Signed)
Pt here for repeat BHCG. Having increased pain that when seen two days ago. Was spotting initially, now states bleeding has picked up somewhat however pad is dry.

## 2014-01-14 NOTE — MAU Provider Note (Signed)
  History     CSN: [id number]  Arrival date and time: 01/14/14 0954   None     Chief Complaint  Patient presents with  . Follow-Up BHCG    HPI Comments: [name] 23 y.o. [id number] presents to MAU for follow up with BHCG. On 12/9 it was 12,957. She has no pain now, and no bleeding since last night. She has not passed any tissue. She declines repeat ultrasound in 10-14 days that was recommended. She states she wants medication to " get this over with " . She was advised at she would need to be seen in MAU for actual visit. She was not prepared to stay today.      Past Medical History  Diagnosis Date  . No pertinent past medical history   . Medical history non-contributory     Past Surgical History  Procedure Laterality Date  . No past surgeries      Family History  Problem Relation Age of Onset  . Diabetes Mother   . Hyperlipidemia Mother     History  Substance Use Topics  . Smoking status: Never Smoker   . Smokeless tobacco: Not on file  . Alcohol Use: No    Allergies:  Allergies  Allergen Reactions  . Pollen Extract Other (See Comments)    Migraines    No prescriptions prior to admission    Review of Systems  Constitutional: Negative.   HENT: Negative.   Respiratory: Negative.   Cardiovascular: Negative.   Genitourinary: Negative.   Musculoskeletal: Negative.   Skin: Negative.   Neurological: Negative.   Psychiatric/Behavioral: Negative.    Physical Exam   Blood pressure 115/60, pulse 82, temperature 98.3 F (36.8 C), temperature source Oral, resp. rate 16, height 5\' 4"  (1.626 m), weight 83.519 kg (184 lb 2 oz), last menstrual period 11/15/2013, not currently breastfeeding.  Physical Exam  Constitutional: She is oriented to person, place, and time. She appears well-developed and well-nourished. No distress.  HENT:  Head: Normocephalic and atraumatic.  Eyes: Pupils are equal, round, and reactive to light.  Neurological: She is alert and  oriented to person, place, and time.  Skin: Skin is warm and dry.  Psychiatric: She has a normal mood and affect. Her behavior is normal. Judgment and thought content normal.   Results for orders placed or performed during the hospital encounter of 01/14/14 (from the past 24 hour(s))  hCG, quantitative, pregnancy     Status: Abnormal   Collection Time: 01/14/14 10:05 AM  Result Value Ref Range   hCG, Beta Chain, [name], S 16061 (H) <5 mIU/mL   Lab Results  Component Value Date   HCGBETAQNT 16061* 01/14/2014   HCGBETAQNT 12957* 01/12/2014   HCGBETAQNT [id number]* 07/27/2011     MAU Course  Procedures  MDM   Assessment and Plan   A: Threatened miscarriage  P: Advised repeat ultrasound on 01/26/14 but she declines Repeat HGC in 48 hours Given information on Cytotec Advised pelvic rest Return to MAU as needed   Carolynn ServeBarefoot, Nimai Burbach Miller 01/14/2014, 1:38 PM

## 2014-01-16 ENCOUNTER — Inpatient Hospital Stay (HOSPITAL_COMMUNITY)
Admission: AD | Admit: 2014-01-16 | Discharge: 2014-01-16 | Disposition: A | Discharge status: Home / Self Care | Payer: Medicaid Other | Source: Ambulatory Visit | Attending: Obstetrics and Gynecology | Admitting: Obstetrics and Gynecology

## 2014-01-16 ENCOUNTER — Inpatient Hospital Stay (HOSPITAL_COMMUNITY): Payer: Medicaid Other

## 2014-01-16 DIAGNOSIS — O26891 Other specified pregnancy related conditions, first trimester: Secondary | ICD-10-CM

## 2014-01-16 DIAGNOSIS — O3680X0 Pregnancy with inconclusive fetal viability, not applicable or unspecified: Secondary | ICD-10-CM

## 2014-01-16 DIAGNOSIS — Z3A08 8 weeks gestation of pregnancy: Secondary | ICD-10-CM | POA: Diagnosis not present

## 2014-01-16 DIAGNOSIS — O9989 Other specified diseases and conditions complicating pregnancy, childbirth and the puerperium: Secondary | ICD-10-CM | POA: Diagnosis not present

## 2014-01-16 DIAGNOSIS — R109 Unspecified abdominal pain: Secondary | ICD-10-CM | POA: Diagnosis present

## 2014-01-16 DIAGNOSIS — N949 Unspecified condition associated with female genital organs and menstrual cycle: Secondary | ICD-10-CM | POA: Diagnosis not present

## 2014-01-16 DIAGNOSIS — R102 Pelvic and perineal pain: Secondary | ICD-10-CM

## 2014-01-16 LAB — CBC WITH DIFFERENTIAL/PLATELET
BASOS ABS: 0 10*3/uL (ref 0.0–0.1)
Basophils Relative: 0 % (ref 0–1)
EOS PCT: 4 % (ref 0–5)
Eosinophils Absolute: 0.4 10*3/uL (ref 0.0–0.7)
HCT: 35.6 % — ABNORMAL LOW (ref 36.0–46.0)
Hemoglobin: 11.8 g/dL — ABNORMAL LOW (ref 12.0–15.0)
LYMPHS ABS: 3.2 10*3/uL (ref 0.7–4.0)
Lymphocytes Relative: 33 % (ref 12–46)
MCH: 26.2 pg (ref 26.0–34.0)
MCHC: 33.1 g/dL (ref 30.0–36.0)
MCV: 79.1 fL (ref 78.0–100.0)
Monocytes Absolute: 0.7 10*3/uL (ref 0.1–1.0)
Monocytes Relative: 7 % (ref 3–12)
Neutro Abs: 5.3 10*3/uL (ref 1.7–7.7)
Neutrophils Relative %: 56 % (ref 43–77)
PLATELETS: 336 10*3/uL (ref 150–400)
RBC: 4.5 MIL/uL (ref 3.87–5.11)
RDW: 14.1 % (ref 11.5–15.5)
WBC: 9.6 10*3/uL (ref 4.0–10.5)

## 2014-01-16 LAB — COMPREHENSIVE METABOLIC PANEL
ALT: 13 U/L (ref 0–35)
AST: 14 U/L (ref 0–37)
Albumin: 3.7 g/dL (ref 3.5–5.2)
Alkaline Phosphatase: 54 U/L (ref 39–117)
Anion gap: 13 (ref 5–15)
BUN: 12 mg/dL (ref 6–23)
CO2: 23 mEq/L (ref 19–32)
Calcium: 9.2 mg/dL (ref 8.4–10.5)
Chloride: 100 mEq/L (ref 96–112)
Creatinine, Ser: 0.62 mg/dL (ref 0.50–1.10)
GFR calc non Af Amer: >90 mL/min (ref >90)
GLUCOSE: 93 mg/dL (ref 70–99)
Potassium: 3.4 mEq/L — ABNORMAL LOW (ref 3.7–5.3)
Sodium: 136 mEq/L — ABNORMAL LOW (ref 137–147)
Total Bilirubin: 0.4 mg/dL (ref 0.3–1.2)
Total Protein: 7.1 g/dL (ref 6.0–8.3)

## 2014-01-16 LAB — HCG, QUANTITATIVE, PREGNANCY: hCG, Beta Chain, Quant, S: 17920 m[IU]/mL — ABNORMAL HIGH (ref <5)

## 2014-01-16 MED ORDER — METHOTREXATE INJECTION FOR WOMEN'S HOSPITAL
50.0000 mg/m2 | Freq: Once | INTRAMUSCULAR | Status: AC
  Administered 2014-01-16: 95 mg via INTRAMUSCULAR
  Filled 2014-01-16: qty 1.9

## 2014-01-16 NOTE — MAU Provider Note (Signed)
History     CSN: [id number]  Arrival date and time: 01/16/14 1831   None     Chief Complaint  Patient presents with  . Labs Only    f/u hcg   HPI  [name] is a 23 y.o. [id number] at 5219w6d. She has first MAU visit 12/9 for bleeding and cramping x 6 d, not heavy. Her BHCG was 12,957. U/S saw irregular fluid collections in the uterus, no IUGS or adnexal masses. She returned in 48 hr on 12/11, her BHCG was 16,061. She was interested in Cytotec but did not have time to stay for a visit. Today she returns for BHCG,  Decreased cramping and bleeding. She is still interested in Cytotec.  OB History    Gravida Para Term Preterm AB TAB SAB Ectopic Multiple Living   4 2 2  0 1 1 0 0 0 2      Past Medical History  Diagnosis Date  . No pertinent past medical history   . Medical history non-contributory     Past Surgical History  Procedure Laterality Date  . No past surgeries      Family History  Problem Relation Age of Onset  . Diabetes Mother   . Hyperlipidemia Mother     History  Substance Use Topics  . Smoking status: Never Smoker   . Smokeless tobacco: Not on file  . Alcohol Use: No    Allergies:  Allergies  Allergen Reactions  . Pollen Extract Other (See Comments)    Migraines    Prescriptions prior to admission  Medication Sig Dispense Refill Last Dose  . ferrous sulfate 325 (65 FE) MG tablet Take 1 tablet (325 mg total) by mouth daily with breakfast. (Patient not taking: Reported on 01/12/2014) 30 tablet 0 Past Week at Unknown  . oxyCODONE-acetaminophen (PERCOCET/ROXICET) 5-325 MG per tablet Take 1-2 tablets by mouth every 4 (four) hours as needed for pain (for pain scale > 4). (Patient not taking: Reported on 01/12/2014) 40 tablet 0   . Prenatal Vit-Fe Fumarate-FA (PRENATAL MULTIVITAMIN) TABS Take 1 tablet by mouth daily.   01/12/2014 at Unknown time    Review of Systems  Constitutional: Negative for fever and chills.  Gastrointestinal: Positive for abdominal  pain (mild cramping). Negative for nausea, vomiting, diarrhea and constipation.  Genitourinary: Negative for dysuria, urgency and frequency.       Scant bleeding  Neurological: Negative for dizziness and weakness.   Physical Exam   Blood pressure 122/76, pulse 81, temperature 98.9 F (37.2 C), temperature source Oral, resp. rate 16, height 5\' 4"  (1.626 m), weight 83.734 kg (184 lb 9.6 oz), last menstrual period 11/15/2013, SpO2 100 %, not currently breastfeeding.  Physical Exam  Constitutional: She is oriented to person, place, and time. She appears well-developed and well-nourished.  Musculoskeletal: Normal range of motion.  Neurological: She is alert and oriented to person, place, and time.  Skin: Skin is warm and dry.  Psychiatric: She has a normal mood and affect. Her behavior is normal.    MAU Course  Procedures  MDM BHCG increased from 16,061 on 12/11 to 17,920. Will repeat U/S to determine location of pregnancy. Consulted with Dr Jolayne Pantheronstant.  Plan reviewed with the pt. [facility] Emmamarie Kluender,CNM  Results for orders placed or performed during the hospital encounter of 01/16/14 (from the past 24 hour(s))  hCG, quantitative, pregnancy     Status: Abnormal   Collection Time: 01/16/14  7:20 PM  Result Value Ref Range   hCG,  Beta Chain, [name], S 17920 (H) <5 mIU/mL  CBC with Differential     Status: Abnormal   Collection Time: 01/16/14  7:20 PM  Result Value Ref Range   WBC 9.6 4.0 - 10.5 K/uL   RBC 4.50 3.87 - 5.11 MIL/uL   Hemoglobin 11.8 (L) 12.0 - 15.0 g/dL   HCT 16.1 (L) 09.6 - 04.5 %   MCV 79.1 78.0 - 100.0 fL   MCH 26.2 26.0 - 34.0 pg   MCHC 33.1 30.0 - 36.0 g/dL   RDW 40.9 81.1 - 91.4 %   Platelets 336 150 - 400 K/uL   Neutrophils Relative % 56 43 - 77 %   Neutro Abs 5.3 1.7 - 7.7 K/uL   Lymphocytes Relative 33 12 - 46 %   Lymphs Abs 3.2 0.7 - 4.0 K/uL   Monocytes Relative 7 3 - 12 %   Monocytes Absolute 0.7 0.1 - 1.0 K/uL   Eosinophils Relative 4 0 - 5 %    Eosinophils Absolute 0.4 0.0 - 0.7 K/uL   Basophils Relative 0 0 - 1 %   Basophils Absolute 0.0 0.0 - 0.1 K/uL  Comprehensive metabolic panel     Status: Abnormal   Collection Time: 01/16/14  7:20 PM  Result Value Ref Range   Sodium 136 (L) 137 - 147 mEq/L   Potassium 3.4 (L) 3.7 - 5.3 mEq/L   Chloride 100 96 - 112 mEq/L   CO2 23 19 - 32 mEq/L   Glucose, Bld 93 70 - 99 mg/dL   BUN 12 6 - 23 mg/dL   Creatinine, Ser 7.82 0.50 - 1.10 mg/dL   Calcium 9.2 8.4 - 95.6 mg/dL   Total Protein 7.1 6.0 - 8.3 g/dL   Albumin 3.7 3.5 - 5.2 g/dL   AST 14 0 - 37 U/L   ALT 13 0 - 35 U/L   Alkaline Phosphatase 54 39 - 117 U/L   Total Bilirubin 0.4 0.3 - 1.2 mg/dL   GFR calc non Af Amer >90 >90 mL/min   GFR calc Af Amer >90 >90 mL/min   Anion gap 13 5 - 15   US Ob Transvaginal  01/16/2014   CLINICAL DATA:  Pregnancy, pelvic pain and bleeding.  EXAM: TRANSVAGINAL OB ULTRASOUND  TECHNIQUE: Transvaginal ultrasound was performed for complete evaluation of the gestation as well as the maternal uterus, adnexal regions, and pelvic cul-de-sac.  COMPARISON:  Ultrasound of January 12, 2014.  FINDINGS: There is again noted irregular and complex intrauterine fluid collection.  Yolk sac:  Not visualized.  Embryo:  Not visualized.  Cardiac Activity: Not visualized.  Maternal uterus/adnexae: Right ovary appears normal. Left ovary appears normal.  IMPRESSION: There is again noted multiple irregular cystic areas within the endometrium without definite evidence of gestational sac, embryonic pole or yolk sac. This is not significantly changed compared to prior ultrasound. This is nonspecific in appearance and in the clinical setting of elevated beta HCG level, the differential includes early intrauterine gestation complicated by subchorionic hemorrhage, abortion in progress, missed abortion with retained products of conception or possibly nonvisualized ectopic pregnancy. Continued follow-up with beta HCG levels and follow-up  ultrasound in 7-10 days is recommended.   Electronically Signed   By: Roque Lias M.D.   On: 01/16/2014 21:25   DW the patient at length, the R/B/A to MTX. She is agreeable to proceeding with MTX at this time. All questions answered.   Assessment and Plan   1. Pregnancy of unknown anatomic location  2. Pelvic pain affecting pregnancy in first trimester, antepartum    Ectopic precautions MTX given today FU on day 4, Wednesday Return to MAU sooner if needed  Follow-up Information    [facility]   Why:  Wednesday 01/19/14   Contact information:   [address] [id number] FA [id number] mc [place] [id number] [phone]      Rodell PernaHARRIS, KATHY M. 01/16/2014, 8:29 PM

## 2014-01-16 NOTE — MAU Note (Signed)
Here for follow up blood work. States was told she would get cytotec depending on her bloodwork results. Some abdominal cramping and bleeding, states has decreased since other day when she was here.

## 2014-01-16 NOTE — Discharge Instructions (Signed)
°Ectopic Pregnancy °An ectopic pregnancy is when the fertilized egg attaches (implants) outside the uterus. Most ectopic pregnancies occur in the fallopian tube. Rarely do ectopic pregnancies occur on the ovary, intestine, pelvis, or cervix. In an ectopic pregnancy, the fertilized egg does not have the ability to develop into a normal, healthy baby.  °A ruptured ectopic pregnancy is one in which the fallopian tube gets torn or bursts and results in internal bleeding. Often there is intense abdominal pain, and sometimes, vaginal bleeding. Having an ectopic pregnancy can be life threatening. If left untreated, this dangerous condition can lead to a blood transfusion, abdominal surgery, or even death. °CAUSES  °Damage to the fallopian tubes is the suspected cause in most ectopic pregnancies.  °RISK FACTORS °Depending on your circumstances, the risk of having an ectopic pregnancy will vary. The level of risk can be divided into three categories. °High Risk °· You have gone through infertility treatment. °· You have had a previous ectopic pregnancy. °· You have had previous tubal surgery. °· You have had previous surgery to have the fallopian tubes tied (tubal ligation). °· You have tubal problems or diseases. °· You have been exposed to DES. DES is a medicine that was used until 1971 and had effects on babies whose mothers took the medicine. °· You become pregnant while using an intrauterine device (IUD) for birth control.  °Moderate Risk °· You have a history of infertility. °· You have a history of a sexually transmitted infection (STI). °· You have a history of pelvic inflammatory disease (PID). °· You have scarring from endometriosis. °· You have multiple sexual partners. °· You smoke.  °Low Risk °· You have had previous pelvic surgery. °· You use vaginal douching. °· You became sexually active before 24 years of age. °SIGNS AND SYMPTOMS  °An ectopic pregnancy should be suspected in anyone who has missed a period  and has abdominal pain or bleeding. °· You may experience normal pregnancy symptoms, such as: °¨ Nausea. °¨ Tiredness. °¨ Breast tenderness. °· Other symptoms may include: °¨ Pain with intercourse. °¨ Irregular vaginal bleeding or spotting. °¨ Cramping or pain on one side or in the lower abdomen. °¨ Fast heartbeat. °¨ Passing out while having a bowel movement. °· Symptoms of a ruptured ectopic pregnancy and internal bleeding may include: °¨ Sudden, severe pain in the abdomen and pelvis. °¨ Dizziness or fainting. °¨ Pain in the shoulder area. °DIAGNOSIS  °Tests that may be performed include: °· A pregnancy test. °· An ultrasound test. °· Testing the specific level of pregnancy hormone in the bloodstream. °· Taking a sample of uterus tissue (dilation and curettage, D&C). °· Surgery to perform a visual exam of the inside of the abdomen using a thin, lighted tube with a tiny camera on the end (laparoscope). °TREATMENT  °An injection of a medicine called methotrexate may be given. This medicine causes the pregnancy tissue to be absorbed. It is given if: °· The diagnosis is made early. °· The fallopian tube has not ruptured. °· You are considered to be a good candidate for the medicine. °Usually, pregnancy hormone blood levels are checked after methotrexate treatment. This is to be sure the medicine is effective. It may take 4-6 weeks for the pregnancy to be absorbed (though most pregnancies will be absorbed by 3 weeks). °Surgical treatment may be needed. A laparoscope may be used to remove the pregnancy tissue. If severe internal bleeding occurs, a cut (incision) may be made in the lower abdomen (laparotomy), and the ectopic   pregnancy is removed. This stops the bleeding. Part of the fallopian tube, or the whole tube, may be removed as well (salpingectomy). After surgery, pregnancy hormone tests may be done to be sure there is no pregnancy tissue left. You may receive a Rho (D) immune globulin shot if you are Rh negative  and the father is Rh positive, or if you do not know the Rh type of the father. This is to prevent problems with any future pregnancy. °SEEK IMMEDIATE MEDICAL CARE IF:  °You have any symptoms of an ectopic pregnancy. This is a medical emergency. °MAKE SURE YOU: °· Understand these instructions. °· Will watch your condition. °· Will get help right away if you are not doing well or get worse. °Document Released: 02/29/2004 Document Revised: 06/07/2013 Document Reviewed: 08/20/2012 °Exit[employer]® Patient Information ©2015 [employer]. This information is not intended to replace advice given to you by your health care provider. Make sure you discuss any questions you have with your health care provider. ° ° °

## 2014-01-20 ENCOUNTER — Inpatient Hospital Stay (HOSPITAL_COMMUNITY)
Admission: AD | Admit: 2014-01-20 | Discharge: 2014-01-20 | Disposition: A | Discharge status: Home / Self Care | Payer: Medicaid Other | Source: Ambulatory Visit | Attending: Obstetrics and Gynecology | Admitting: Obstetrics and Gynecology

## 2014-01-20 DIAGNOSIS — O3680X0 Pregnancy with inconclusive fetal viability, not applicable or unspecified: Secondary | ICD-10-CM

## 2014-01-20 DIAGNOSIS — O009 Ectopic pregnancy, unspecified: Secondary | ICD-10-CM | POA: Insufficient documentation

## 2014-01-20 DIAGNOSIS — O001 Tubal pregnancy: Secondary | ICD-10-CM

## 2014-01-20 LAB — HCG, QUANTITATIVE, PREGNANCY: hCG, Beta Chain, Quant, S: 18611 m[IU]/mL — ABNORMAL HIGH (ref <5)

## 2014-01-20 NOTE — Discharge Instructions (Signed)
°Ectopic Pregnancy °An ectopic pregnancy is when the fertilized egg attaches (implants) outside the uterus. Most ectopic pregnancies occur in the fallopian tube. Rarely do ectopic pregnancies occur on the ovary, intestine, pelvis, or cervix. In an ectopic pregnancy, the fertilized egg does not have the ability to develop into a normal, healthy baby.  °A ruptured ectopic pregnancy is one in which the fallopian tube gets torn or bursts and results in internal bleeding. Often there is intense abdominal pain, and sometimes, vaginal bleeding. Having an ectopic pregnancy can be life threatening. If left untreated, this dangerous condition can lead to a blood transfusion, abdominal surgery, or even death. °CAUSES  °Damage to the fallopian tubes is the suspected cause in most ectopic pregnancies.  °RISK FACTORS °Depending on your circumstances, the risk of having an ectopic pregnancy will vary. The level of risk can be divided into three categories. °High Risk °· You have gone through infertility treatment. °· You have had a previous ectopic pregnancy. °· You have had previous tubal surgery. °· You have had previous surgery to have the fallopian tubes tied (tubal ligation). °· You have tubal problems or diseases. °· You have been exposed to DES. DES is a medicine that was used until 1971 and had effects on babies whose mothers took the medicine. °· You become pregnant while using an intrauterine device (IUD) for birth control.  °Moderate Risk °· You have a history of infertility. °· You have a history of a sexually transmitted infection (STI). °· You have a history of pelvic inflammatory disease (PID). °· You have scarring from endometriosis. °· You have multiple sexual partners. °· You smoke.  °Low Risk °· You have had previous pelvic surgery. °· You use vaginal douching. °· You became sexually active before 24 years of age. °SIGNS AND SYMPTOMS  °An ectopic pregnancy should be suspected in anyone who has missed a period  and has abdominal pain or bleeding. °· You may experience normal pregnancy symptoms, such as: °¨ Nausea. °¨ Tiredness. °¨ Breast tenderness. °· Other symptoms may include: °¨ Pain with intercourse. °¨ Irregular vaginal bleeding or spotting. °¨ Cramping or pain on one side or in the lower abdomen. °¨ Fast heartbeat. °¨ Passing out while having a bowel movement. °· Symptoms of a ruptured ectopic pregnancy and internal bleeding may include: °¨ Sudden, severe pain in the abdomen and pelvis. °¨ Dizziness or fainting. °¨ Pain in the shoulder area. °DIAGNOSIS  °Tests that may be performed include: °· A pregnancy test. °· An ultrasound test. °· Testing the specific level of pregnancy hormone in the bloodstream. °· Taking a sample of uterus tissue (dilation and curettage, D&C). °· Surgery to perform a visual exam of the inside of the abdomen using a thin, lighted tube with a tiny camera on the end (laparoscope). °TREATMENT  °An injection of a medicine called methotrexate may be given. This medicine causes the pregnancy tissue to be absorbed. It is given if: °· The diagnosis is made early. °· The fallopian tube has not ruptured. °· You are considered to be a good candidate for the medicine. °Usually, pregnancy hormone blood levels are checked after methotrexate treatment. This is to be sure the medicine is effective. It may take 4-6 weeks for the pregnancy to be absorbed (though most pregnancies will be absorbed by 3 weeks). °Surgical treatment may be needed. A laparoscope may be used to remove the pregnancy tissue. If severe internal bleeding occurs, a cut (incision) may be made in the lower abdomen (laparotomy), and the ectopic   pregnancy is removed. This stops the bleeding. Part of the fallopian tube, or the whole tube, may be removed as well (salpingectomy). After surgery, pregnancy hormone tests may be done to be sure there is no pregnancy tissue left. You may receive a Rho (D) immune globulin shot if you are Rh negative  and the father is Rh positive, or if you do not know the Rh type of the father. This is to prevent problems with any future pregnancy. °SEEK IMMEDIATE MEDICAL CARE IF:  °You have any symptoms of an ectopic pregnancy. This is a medical emergency. °MAKE SURE YOU: °· Understand these instructions. °· Will watch your condition. °· Will get help right away if you are not doing well or get worse. °Document Released: 02/29/2004 Document Revised: 06/07/2013 Document Reviewed: 08/20/2012 °Exit[employer]® Patient Information ©2015 [employer]. This information is not intended to replace advice given to you by your health care provider. Make sure you discuss any questions you have with your health care provider. ° ° °

## 2014-01-20 NOTE — MAU Provider Note (Signed)
  History     CSN: [id number]  Arrival date and time: 01/20/14 1954   None     No chief complaint on file.  HPI  [name] is a 23 y.o. [id number] at 4540w3d who presents today for day #4 FU for MTX. She was supposed to come yesterday, but was unable to come at that time. She has had some spotting and cramping.   Past Medical History  Diagnosis Date  . No pertinent past medical history   . Medical history non-contributory     Past Surgical History  Procedure Laterality Date  . No past surgeries      Family History  Problem Relation Age of Onset  . Diabetes Mother   . Hyperlipidemia Mother     History  Substance Use Topics  . Smoking status: Never Smoker   . Smokeless tobacco: Not on file  . Alcohol Use: No    Allergies:  Allergies  Allergen Reactions  . Pollen Extract Other (See Comments)    Migraines    Prescriptions prior to admission  Medication Sig Dispense Refill Last Dose  . ferrous sulfate 325 (65 FE) MG tablet Take 1 tablet (325 mg total) by mouth daily with breakfast. (Patient not taking: Reported on 01/12/2014) 30 tablet 0 Past Week at Unknown  . oxyCODONE-acetaminophen (PERCOCET/ROXICET) 5-325 MG per tablet Take 1-2 tablets by mouth every 4 (four) hours as needed for pain (for pain scale > 4). (Patient not taking: Reported on 01/12/2014) 40 tablet 0   . Prenatal Vit-Fe Fumarate-FA (PRENATAL MULTIVITAMIN) TABS Take 1 tablet by mouth daily.   01/12/2014 at Unknown time    ROS Physical Exam   Last menstrual period 11/15/2013, not currently breastfeeding.  Physical Exam  Nursing note and vitals reviewed. Constitutional: She is oriented to person, place, and time. She appears well-developed and well-nourished. No distress.  Cardiovascular: Normal rate.   Respiratory: Effort normal.  GI: Soft. There is no tenderness.  Neurological: She is alert and oriented to person, place, and time.  Skin: Skin is warm and dry.  Psychiatric: She has a normal mood and  affect.    MAU Course  Procedures  Results for Elizabeth LuzMOBIO, Marja (MRN [id number]) as of 01/20/2014 20:48  Ref. Range 01/16/2014 19:20 01/16/2014 21:15 01/20/2014 20:01  hCG, Beta Chain, [name], S Latest Range: <5 mIU/mL 17920 (H)  18611 (H)                                                    DAY #1                                         Day #5  Assessment and Plan   1. Pregnancy of unknown anatomic location    Ectopic precautions  Day #4 FU MTX Return saturday for day #7  Follow-up Information    [facility].   Why:  Saturday 01/22/14   Contact information:   [address] [id number] mc [place] [id number] [phone]        Tawnya CrookHogan, Kabe Mckoy Donovan 01/20/2014, 8:49 PM

## 2014-01-20 NOTE — MAU Note (Addendum)
PT WAS SUPPOSE    TO COME YESTERDAY  FOR LABS- -  DID NOT  WANT  TO COME .Marland Kitchen.  HAD SOME BLEEDING  TODAY-     IN TRIAGE-  SMALL AMT  LIGHT  BROWN.    HAD SLIGHT  CRAMPS    TODAY-  NO MEDS.   HAD SEX  TODAY.   AND FEELS PAIN  DEEP IN HER VAGINA

## 2014-01-22 ENCOUNTER — Inpatient Hospital Stay (HOSPITAL_COMMUNITY)
Admission: AD | Admit: 2014-01-22 | Discharge: 2014-01-23 | Disposition: A | Discharge status: Home / Self Care | Payer: Medicaid Other | Source: Ambulatory Visit | Attending: Obstetrics & Gynecology | Admitting: Obstetrics & Gynecology

## 2014-01-22 DIAGNOSIS — Z3A09 9 weeks gestation of pregnancy: Secondary | ICD-10-CM | POA: Diagnosis not present

## 2014-01-22 DIAGNOSIS — O200 Threatened abortion: Secondary | ICD-10-CM | POA: Diagnosis not present

## 2014-01-22 DIAGNOSIS — O009 Unspecified ectopic pregnancy without intrauterine pregnancy: Secondary | ICD-10-CM

## 2014-01-22 NOTE — Progress Notes (Signed)
History   Chief Complaint:  No chief complaint on file.   [name] is  23 y.o. [id number] Patient's last menstrual period was 11/15/2013 (exact date).. Patient is here for follow up of quantitative HCG day#7 s/p methotrexate for suspected ectopic and ongoing surveillance of pregnancy status.     Since her last visit, the patient is without new complaint.   The patient reports bleeding as  none now.  Reports having bleeding earlier in the day, but has since ceased.  Denies any pelvic pain.   General ROS:  negative for vaginal bleeding or pelvic pain.    Her previous Quantitative HCG values are:   Ref. Range 01/12/2014 10:50 01/14/2014 10:05 01/16/2014 19:20 01/20/2014 20:01  hCG, Beta Chain, [name], S Latest Range: <5 mIU/mL 12957 (H) 16061 (H) 17920 (H) 18611 (H)       Physical Exam   Last menstrual period 11/15/2013, not currently breastfeeding.  Focused Gynecological Exam:  Filed Vitals:   01/22/14 2317  BP: 119/68  Pulse: 77  Temp: 98.8 F (37.1 C)  Resp: 18    General:  Alert and oriented x 3.  No signs of acute distress.  Labs: Results for orders placed or performed during the hospital encounter of 01/22/14 (from the past 24 hour(s))  hCG, quantitative, pregnancy     Status: Abnormal   Collection Time: 01/22/14 11:20 PM  Result Value Ref Range   hCG, Beta Chain, [name], S 12422 (H) <5 mIU/mL    Ultrasound Studies:   [place] Ob Comp Less 14 Wks  01/12/2014   CLINICAL DATA:  Bleeding and cramping.  EXAM: OBSTETRIC <14 WK US AND TRANSVAGINAL OB US  TECHNIQUE: Both transabdominal and transvaginal ultrasound examinations were performed for complete evaluation of the gestation as well as the maternal uterus, adnexal regions, and pelvic cul-de-sac. Transvaginal technique was performed to assess early pregnancy.  COMPARISON:  None.  FINDINGS: Intrauterine gestational sac: There are irregularly configured intrauterine fluid collections. The largest measures 1 cm. None a these contain  a definite yolk sac or embryo.  Yolk sac:  No  Embryo:  No  Cardiac Activity: Not applicable  MSD:  Not measurable.  Maternal uterus/adnexae:  Subchorionic hemorrhage: None  Right ovary: Normal  Left ovary: Normal  Other :None  Free fluid:  None  IMPRESSION: 1. There are several irregular the shaped fluid intrauterine fluid collections. The appearance is nonspecific. In the setting of a elevated beta HCG Differential considerations include intrauterine pregnancy early pregnancy complicated by subchorionic hemorrhage, abortion in progress, missed abortion with retained products, or less likely ectopic pregnancy. Followup ultrasound and serial quantitative beta HCG is recommended in 10-14 days for further evaluation.   Electronically Signed   By: Signa Kellaylor  Stroud M.D.   On: 01/12/2014 12:55   [place] Ob Transvaginal  01/16/2014   CLINICAL DATA:  Pregnancy, pelvic pain and bleeding.  EXAM: TRANSVAGINAL OB ULTRASOUND  TECHNIQUE: Transvaginal ultrasound was performed for complete evaluation of the gestation as well as the maternal uterus, adnexal regions, and pelvic cul-de-sac.  COMPARISON:  Ultrasound of January 12, 2014.  FINDINGS: There is again noted irregular and complex intrauterine fluid collection.  Yolk sac:  Not visualized.  Embryo:  Not visualized.  Cardiac Activity: Not visualized.  Maternal uterus/adnexae: Right ovary appears normal. Left ovary appears normal.  IMPRESSION: There is again noted multiple irregular cystic areas within the endometrium without definite evidence of gestational sac, embryonic pole or yolk sac. This is not significantly changed compared to prior ultrasound. This  is nonspecific in appearance and in the clinical setting of elevated beta HCG level, the differential includes early intrauterine gestation complicated by subchorionic hemorrhage, abortion in progress, missed abortion with retained products of conception or possibly nonvisualized ectopic pregnancy. Continued follow-up with beta  HCG levels and follow-up ultrasound in 7-10 days is recommended.   Electronically Signed   By: Roque LiasJames  Green M.D.   On: 01/16/2014 21:25   [place] Ob Transvaginal  01/12/2014   CLINICAL DATA:  Bleeding and cramping.  EXAM: OBSTETRIC <14 WK US AND TRANSVAGINAL OB US  TECHNIQUE: Both transabdominal and transvaginal ultrasound examinations were performed for complete evaluation of the gestation as well as the maternal uterus, adnexal regions, and pelvic cul-de-sac. Transvaginal technique was performed to assess early pregnancy.  COMPARISON:  None.  FINDINGS: Intrauterine gestational sac: There are irregularly configured intrauterine fluid collections. The largest measures 1 cm. None a these contain a definite yolk sac or embryo.  Yolk sac:  No  Embryo:  No  Cardiac Activity: Not applicable  MSD:  Not measurable.  Maternal uterus/adnexae:  Subchorionic hemorrhage: None  Right ovary: Normal  Left ovary: Normal  Other :None  Free fluid:  None  IMPRESSION: 1. There are several irregular the shaped fluid intrauterine fluid collections. The appearance is nonspecific. In the setting of a elevated beta HCG Differential considerations include intrauterine pregnancy early pregnancy complicated by subchorionic hemorrhage, abortion in progress, missed abortion with retained products, or less likely ectopic pregnancy. Followup ultrasound and serial quantitative beta HCG is recommended in 10-14 days for further evaluation.   Electronically Signed   By: Signa Kellaylor  Stroud M.D.   On: 01/12/2014 12:55    Assessment: Suspected Ectopic Pregnancy - s/p Methotrexate Day#7 @ 4871w5d weeks gestation    Plan: The patient is instructed to follow up in in one week (Saturday). Reviewed ectopic precautions.  Marland Kitchen.  [name], [name] 01/22/2014, 11:10 PM

## 2014-01-22 NOTE — MAU Note (Signed)
Here for BHCG after MTX. Last Sunday. States having some intermittent bleeding and pain which is not new.

## 2014-01-23 DIAGNOSIS — O200 Threatened abortion: Secondary | ICD-10-CM | POA: Diagnosis not present

## 2014-01-23 DIAGNOSIS — O009 Ectopic pregnancy, unspecified: Secondary | ICD-10-CM

## 2014-01-23 LAB — HCG, QUANTITATIVE, PREGNANCY: HCG, BETA CHAIN, QUANT, S: 12422 m[IU]/mL — AB (ref <5)

## 2014-01-23 NOTE — MAU Note (Signed)
Elizabeth PagesWalidah Karim CNM in Triage to discuss test results and d/c plan. Pt d/c home from Triage

## 2014-01-31 ENCOUNTER — Inpatient Hospital Stay (HOSPITAL_COMMUNITY)
Admission: AD | Admit: 2014-01-31 | Discharge: 2014-01-31 | Disposition: A | Discharge status: Home / Self Care | Payer: Medicaid Other | Source: Ambulatory Visit | Attending: Family Medicine | Admitting: Family Medicine

## 2014-01-31 DIAGNOSIS — O001 Tubal pregnancy: Secondary | ICD-10-CM | POA: Diagnosis present

## 2014-01-31 LAB — HCG, QUANTITATIVE, PREGNANCY: hCG, Beta Chain, Quant, S: 496 m[IU]/mL — ABNORMAL HIGH (ref <5)

## 2014-01-31 NOTE — MAU Provider Note (Signed)
History   None      Chief Complaint:  Follow-up   [name] is  23 y.o. [id number] Patient's last menstrual period was 11/15/2013 (exact date).. Patient is here for follow up of quantitative HCG and ongoing surveillance of pregnancy status.   She is 1234w0d weeks gestation  by LMP.    Since her last visit, the patient is without new complaint.   The patient reports bleeding as  none now.  She passed what appears to be a decidual cast and now is not bleeding  General ROS:  negative  Her previous Quantitative HCG values are:  Lab Results  Component Value Date   HCGBETAQNT 496* 01/31/2014   HCGBETAQNT 12422* 01/22/2014   HCGBETAQNT [id number]* 01/20/2014    Physical Exam   Last menstrual period 11/15/2013, not currently breastfeeding.  FLabs: Results for orders placed or performed during the hospital encounter of 01/31/14 (from the past 24 hour(s))  hCG, quantitative, pregnancy   Collection Time: 01/31/14  2:11 PM  Result Value Ref Range   hCG, Beta Chain, [name], S 496 (H) <5 mIU/mL    Ultrasound Studies:   [place] Ob Comp Less 14 Wks  01/12/2014   CLINICAL DATA:  Bleeding and cramping.  EXAM: OBSTETRIC <14 WK US AND TRANSVAGINAL OB US  TECHNIQUE: Both transabdominal and transvaginal ultrasound examinations were performed for complete evaluation of the gestation as well as the maternal uterus, adnexal regions, and pelvic cul-de-sac. Transvaginal technique was performed to assess early pregnancy.  COMPARISON:  None.  FINDINGS: Intrauterine gestational sac: There are irregularly configured intrauterine fluid collections. The largest measures 1 cm. None a these contain a definite yolk sac or embryo.  Yolk sac:  No  Embryo:  No  Cardiac Activity: Not applicable  MSD:  Not measurable.  Maternal uterus/adnexae:  Subchorionic hemorrhage: None  Right ovary: Normal  Left ovary: Normal  Other :None  Free fluid:  None  IMPRESSION: 1. There are several irregular the shaped fluid intrauterine fluid  collections. The appearance is nonspecific. In the setting of a elevated beta HCG Differential considerations include intrauterine pregnancy early pregnancy complicated by subchorionic hemorrhage, abortion in progress, missed abortion with retained products, or less likely ectopic pregnancy. Followup ultrasound and serial quantitative beta HCG is recommended in 10-14 days for further evaluation.   Electronically Signed   By: Signa Kellaylor  Stroud M.D.   On: 01/12/2014 12:55   [place] Ob Transvaginal  01/16/2014   CLINICAL DATA:  Pregnancy, pelvic pain and bleeding.  EXAM: TRANSVAGINAL OB ULTRASOUND  TECHNIQUE: Transvaginal ultrasound was performed for complete evaluation of the gestation as well as the maternal uterus, adnexal regions, and pelvic cul-de-sac.  COMPARISON:  Ultrasound of January 12, 2014.  FINDINGS: There is again noted irregular and complex intrauterine fluid collection.  Yolk sac:  Not visualized.  Embryo:  Not visualized.  Cardiac Activity: Not visualized.  Maternal uterus/adnexae: Right ovary appears normal. Left ovary appears normal.  IMPRESSION: There is again noted multiple irregular cystic areas within the endometrium without definite evidence of gestational sac, embryonic pole or yolk sac. This is not significantly changed compared to prior ultrasound. This is nonspecific in appearance and in the clinical setting of elevated beta HCG level, the differential includes early intrauterine gestation complicated by subchorionic hemorrhage, abortion in progress, missed abortion with retained products of conception or possibly nonvisualized ectopic pregnancy. Continued follow-up with beta HCG levels and follow-up ultrasound in 7-10 days is recommended.   Electronically Signed   By: Marry GuanJames  Green M.D.  On: 01/16/2014 21:25   [place] Ob Transvaginal  01/12/2014   CLINICAL DATA:  Bleeding and cramping.  EXAM: OBSTETRIC <14 WK US AND TRANSVAGINAL OB US  TECHNIQUE: Both transabdominal and transvaginal ultrasound  examinations were performed for complete evaluation of the gestation as well as the maternal uterus, adnexal regions, and pelvic cul-de-sac. Transvaginal technique was performed to assess early pregnancy.  COMPARISON:  None.  FINDINGS: Intrauterine gestational sac: There are irregularly configured intrauterine fluid collections. The largest measures 1 cm. None a these contain a definite yolk sac or embryo.  Yolk sac:  No  Embryo:  No  Cardiac Activity: Not applicable  MSD:  Not measurable.  Maternal uterus/adnexae:  Subchorionic hemorrhage: None  Right ovary: Normal  Left ovary: Normal  Other :None  Free fluid:  None  IMPRESSION: 1. There are several irregular the shaped fluid intrauterine fluid collections. The appearance is nonspecific. In the setting of a elevated beta HCG Differential considerations include intrauterine pregnancy early pregnancy complicated by subchorionic hemorrhage, abortion in progress, missed abortion with retained products, or less likely ectopic pregnancy. Followup ultrasound and serial quantitative beta HCG is recommended in 10-14 days for further evaluation.   Electronically Signed   By: Signa Kellaylor  Stroud M.D.   On: 01/12/2014 12:55    Assessment: Methotrexate therapy, appropriately declining QHCG levels   Plan: FlU QHCG 1 week.  Pt advised not to have unprotected intercourse until Qhcg levels <5  [name],[name] 01/31/2014, 4:19 PM

## 2014-02-08 ENCOUNTER — Inpatient Hospital Stay (HOSPITAL_COMMUNITY)
Admission: AD | Admit: 2014-02-08 | Discharge: 2014-02-08 | Disposition: A | Discharge status: Home / Self Care | Payer: Medicaid Other | Source: Ambulatory Visit | Attending: Obstetrics & Gynecology | Admitting: Obstetrics & Gynecology

## 2014-02-08 DIAGNOSIS — O001 Tubal pregnancy: Secondary | ICD-10-CM | POA: Diagnosis not present

## 2014-02-08 DIAGNOSIS — O009 Unspecified ectopic pregnancy without intrauterine pregnancy: Secondary | ICD-10-CM

## 2014-02-08 LAB — HCG, QUANTITATIVE, PREGNANCY: hCG, Beta Chain, Quant, S: 79 m[IU]/mL — ABNORMAL HIGH (ref <5)

## 2014-02-08 NOTE — MAU Note (Signed)
No pain, no bleeding.  Feeling good

## 2014-02-08 NOTE — MAU Provider Note (Signed)
History   Chief Complaint:  Follow-up   [name] is  24 y.o. [id number] Patient's last menstrual period was 11/15/2013 (exact date).. Patient is here for follow up of quantitative HCG and ongoing surveillance of pregnancy status.   She is 2 weeks 3 days status post methotrexate for ectopic pregnancy with appropriately dropping Quants who is here today for a weekly follow-up [name].    Since her last visit, the patient is without new complaint.   The patient reports bleeding as  none now.    General ROS:  negative  Her previous Quantitative HCG values are:  Results for [name] (MRN [id number]) as of 02/08/2014 17:37  Ref. Range 01/14/2014 10:05 01/16/2014 19:20 01/20/2014 20:01 01/22/2014 23:20 01/31/2014 14:11  hCG, Beta Chain, [name], S Latest Range: <5 mIU/mL 16061 (H) 17920 (H) 18611 (H) 12422 (H) 496 (H)    Physical Exam   Last menstrual period 11/15/2013, not currently breastfeeding.  Focused Gynecological Exam: examination not indicated  Labs: Results for orders placed or performed during the hospital encounter of 02/08/14 (from the past 24 hour(s))  hCG, quantitative, pregnancy   Collection Time: 02/08/14  5:03 PM  Result Value Ref Range   hCG, Beta Chain, [name], S 79 (H) <5 mIU/mL    Ultrasound Studies:   US Ob Comp Less 14 Wks  01/12/2014   CLINICAL DATA:  Bleeding and cramping.  EXAM: OBSTETRIC <14 WK [place] AND TRANSVAGINAL OB US  TECHNIQUE: Both transabdominal and transvaginal ultrasound examinations were performed for complete evaluation of the gestation as well as the maternal uterus, adnexal regions, and pelvic cul-de-sac. Transvaginal technique was performed to assess early pregnancy.  COMPARISON:  None.  FINDINGS: Intrauterine gestational sac: There are irregularly configured intrauterine fluid collections. The largest measures 1 cm. None a these contain a definite yolk sac or embryo.  Yolk sac:  No  Embryo:  No  Cardiac Activity: Not applicable  MSD:  Not measurable.   Maternal uterus/adnexae:  Subchorionic hemorrhage: None  Right ovary: Normal  Left ovary: Normal  Other :None  Free fluid:  None  IMPRESSION: 1. There are several irregular the shaped fluid intrauterine fluid collections. The appearance is nonspecific. In the setting of a elevated beta HCG Differential considerations include intrauterine pregnancy early pregnancy complicated by subchorionic hemorrhage, abortion in progress, missed abortion with retained products, or less likely ectopic pregnancy. Followup ultrasound and serial quantitative beta HCG is recommended in 10-14 days for further evaluation.   Electronically Signed   By: Signa Kell M.D.   On: 01/12/2014 12:55   US Ob Transvaginal  01/16/2014   CLINICAL DATA:  Pregnancy, pelvic pain and bleeding.  EXAM: TRANSVAGINAL OB ULTRASOUND  TECHNIQUE: Transvaginal ultrasound was performed for complete evaluation of the gestation as well as the maternal uterus, adnexal regions, and pelvic cul-de-sac.  COMPARISON:  Ultrasound of January 12, 2014.  FINDINGS: There is again noted irregular and complex intrauterine fluid collection.  Yolk sac:  Not visualized.  Embryo:  Not visualized.  Cardiac Activity: Not visualized.  Maternal uterus/adnexae: Right ovary appears normal. Left ovary appears normal.  IMPRESSION: There is again noted multiple irregular cystic areas within the endometrium without definite evidence of gestational sac, embryonic pole or yolk sac. This is not significantly changed compared to prior ultrasound. This is nonspecific in appearance and in the clinical setting of elevated beta HCG level, the differential includes early intrauterine gestation complicated by subchorionic hemorrhage, abortion in progress, missed abortion with retained products of conception or possibly nonvisualized  ectopic pregnancy. Continued follow-up with beta HCG levels and follow-up ultrasound in 7-10 days is recommended.   Electronically Signed   By: Roque LiasJames  Green M.D.    On: 01/16/2014 21:25   [place] Ob Transvaginal  01/12/2014   CLINICAL DATA:  Bleeding and cramping.  EXAM: OBSTETRIC <14 WK US AND TRANSVAGINAL OB US  TECHNIQUE: Both transabdominal and transvaginal ultrasound examinations were performed for complete evaluation of the gestation as well as the maternal uterus, adnexal regions, and pelvic cul-de-sac. Transvaginal technique was performed to assess early pregnancy.  COMPARISON:  None.  FINDINGS: Intrauterine gestational sac: There are irregularly configured intrauterine fluid collections. The largest measures 1 cm. None a these contain a definite yolk sac or embryo.  Yolk sac:  No  Embryo:  No  Cardiac Activity: Not applicable  MSD:  Not measurable.  Maternal uterus/adnexae:  Subchorionic hemorrhage: None  Right ovary: Normal  Left ovary: Normal  Other :None  Free fluid:  None  IMPRESSION: 1. There are several irregular the shaped fluid intrauterine fluid collections. The appearance is nonspecific. In the setting of a elevated beta HCG Differential considerations include intrauterine pregnancy early pregnancy complicated by subchorionic hemorrhage, abortion in progress, missed abortion with retained products, or less likely ectopic pregnancy. Followup ultrasound and serial quantitative beta HCG is recommended in 10-14 days for further evaluation.   Electronically Signed   By: Signa Kellaylor  Stroud M.D.   On: 01/12/2014 12:55    Assessment: Ectopic pregnancy treated with methotrexate with appropriately dropping Quants  Plan: Discharge home in stable condition. [facility] for weekly Quants until less than 0  [name] 02/08/2014, 5:39 PM

## 2014-02-08 NOTE — Discharge Instructions (Signed)
Methotrexate Treatment for an Ectopic Pregnancy, Care After °Refer to this sheet in the next few weeks. These instructions provide you with information on caring for yourself after your procedure. Your health care provider may also give you more specific instructions. Your treatment has been planned according to current medical practices, but problems sometimes occur. Call your health care provider if you have any problems or questions after your procedure. °WHAT TO EXPECT AFTER THE PROCEDURE °You may have some abdominal cramping, vaginal bleeding, and fatigue in the first few days after taking methotrexate. Some other possible side effects of methotrexate include: °· Nausea. °· Vomiting. °· Diarrhea. °· Mouth sores. °· Swelling or irritation of the lining of your lungs (pneumonitis). °· Liver damage. °· Hair loss. °HOME CARE INSTRUCTIONS  °After you have received the methotrexate medicine, you need to be careful of your activities and watch your condition for several weeks. It may take 1 week before your hormone levels return to normal. °· Keep all follow-up appointments as directed by your health care provider. °· Avoid traveling too far away from your health care provider. °· Do not have sexual intercourse until your health care provider says it is safe to do so. °· You may resume your usual diet. °· Limit strenuous activity. °· Do not take folic acid, prenatal vitamins, or other vitamins that contain folic acid. °· Do not take aspirin, ibuprofen, or naproxen (nonsteroidal anti-inflammatory drugs [NSAIDs]). °· Do not drink alcohol. °SEEK MEDICAL CARE IF:  °· You cannot control your nausea and vomiting. °· You cannot control your diarrhea. °· You have sores in your mouth and want treatment. °· You need pain medicine for your abdominal pain. °· You have a rash. °· You are having a reaction to the medicine. °SEEK IMMEDIATE MEDICAL CARE IF:  °· You have increasing abdominal or pelvic pain. °· You notice increased  bleeding. °· You feel light-headed, or you faint. °· You have shortness of breath. °· Your heart rate increases. °· You have a cough. °· You have chills. °· You have a fever. °Document Released: 01/10/2011 Document Revised: 01/26/2013 Document Reviewed: 11/09/2012 °[employer]® Patient Information ©2015 [employer]. This information is not intended to replace advice given to you by your health care provider. Make sure you discuss any questions you have with your health care provider. ° °

## 2014-02-15 ENCOUNTER — Other Ambulatory Visit: Payer: Self-pay

## 2014-02-16 ENCOUNTER — Other Ambulatory Visit: Payer: Medicaid Other

## 2014-02-16 DIAGNOSIS — O209 Hemorrhage in early pregnancy, unspecified: Secondary | ICD-10-CM

## 2014-02-17 ENCOUNTER — Telehealth: Payer: Self-pay | Admitting: General Practice

## 2014-02-17 LAB — HCG, QUANTITATIVE, PREGNANCY: hCG, Beta Chain, Quant, S: 14.3 m[IU]/mL

## 2014-02-17 NOTE — Telephone Encounter (Signed)
Pt returned call and I informed her that her beta quant level is 14.3 from 02/16/14.  I also informed pt that the provider would like for her to come in one week from 1/13 which will be on Wednesday the 1/20.  Pt stated that she will be able to come in on 02/23/14 @ 0900 for another beta quant.  Erie NoeVanessa placed her on the lab schedule.

## 2014-02-17 NOTE — Telephone Encounter (Signed)
-----   Message from Catalina AntiguaPeggy Constant, MD sent at 02/17/2014  1:41 PM EST ----- Please have the patient return in 1 week for repeat quant HCG  Thanks Peggy

## 2014-02-17 NOTE — Telephone Encounter (Signed)
Called patient, no answer- left message stating we are trying to reach you with results, please call us back at the clinics 

## 2014-02-23 ENCOUNTER — Other Ambulatory Visit: Payer: Medicaid Other

## 2014-02-23 DIAGNOSIS — O009 Unspecified ectopic pregnancy without intrauterine pregnancy: Secondary | ICD-10-CM

## 2014-02-24 LAB — HCG, QUANTITATIVE, PREGNANCY: hCG, Beta Chain, Quant, S: 4.7 m[IU]/mL

## 2014-11-17 ENCOUNTER — Encounter (HOSPITAL_COMMUNITY): Payer: Self-pay | Admitting: *Deleted

## 2016-02-05 NOTE — L&D Delivery Note (Signed)
Delivery Note At 11:20 AM a viable female was delivered via Vaginal, Spontaneous (Presentation: direct OA;  ).  APGAR: , ; weight 5 lb 8.9 oz (2520 g).   Placenta status: intact 3VC:  with no complications: .  Cord pH: pending  Mother came directly from MAU with feeling she needed to push.   First check in room by nursing showed complete +2 w/ bulging bag.  Patient requested IV fentanyl but with delivery imminent it was contraindicated.  Baby delivered rapidly direct OA with no complications, spontaneous cry and good tone.   Cord was clamped by Dr. Parke SimmersBland @~471min and cord cut by mom.  The placenta was delivered intact with minimal bleeding.   There were no lacerations on exam.  Anesthesia:  none Episiotomy: None Lacerations: None Suture Repair: n/a Est. Blood Loss (mL): 100  Mom to postpartum.  Baby to Couplet care / Skin to Skin.  Marthenia RollingScott Bland 01/25/2017, 12:49 PM  I confirm that I have verified the information documented in the resident's note and that I have also personally reperformed the physical exam and all medical decision making activities.  I was gloved and present for entire delivery SVD without incident No difficulty with shoulders No lacerations  Aviva SignsMarie L Raiden Yearwood, CNM

## 2016-05-17 ENCOUNTER — Inpatient Hospital Stay (HOSPITAL_COMMUNITY)
Admission: AD | Admit: 2016-05-17 | Discharge: 2016-05-18 | Disposition: A | Discharge status: Home / Self Care | Payer: Medicaid Other | Source: Ambulatory Visit | Attending: Obstetrics and Gynecology | Admitting: Obstetrics and Gynecology

## 2016-05-17 ENCOUNTER — Encounter (HOSPITAL_COMMUNITY): Payer: Self-pay | Admitting: *Deleted

## 2016-05-17 DIAGNOSIS — O9989 Other specified diseases and conditions complicating pregnancy, childbirth and the puerperium: Secondary | ICD-10-CM | POA: Diagnosis not present

## 2016-05-17 DIAGNOSIS — R109 Unspecified abdominal pain: Secondary | ICD-10-CM

## 2016-05-17 DIAGNOSIS — O26899 Other specified pregnancy related conditions, unspecified trimester: Secondary | ICD-10-CM

## 2016-05-17 DIAGNOSIS — Z3A01 Less than 8 weeks gestation of pregnancy: Secondary | ICD-10-CM | POA: Insufficient documentation

## 2016-05-17 DIAGNOSIS — O26891 Other specified pregnancy related conditions, first trimester: Secondary | ICD-10-CM | POA: Insufficient documentation

## 2016-05-17 DIAGNOSIS — O3680X0 Pregnancy with inconclusive fetal viability, not applicable or unspecified: Secondary | ICD-10-CM

## 2016-05-17 DIAGNOSIS — R1030 Lower abdominal pain, unspecified: Secondary | ICD-10-CM | POA: Diagnosis not present

## 2016-05-17 LAB — POCT PREGNANCY, URINE: Preg Test, Ur: POSITIVE — AB

## 2016-05-17 NOTE — MAU Note (Addendum)
Having pain R side of stomach for few days. No bleeding. Some white vag d/c. Has had positive upt at home

## 2016-05-17 NOTE — MAU Provider Note (Signed)
History     CSN: [id number]  Arrival date and time: 05/17/16 2318   First Provider Initiated Contact with Patient 05/17/16 2344      Chief Complaint  Patient presents with  . Abdominal Pain   HPI [name] is a 26 y.o. [phone] at [redacted]w[redacted]d who presents to MAU today with complaint of lower abdominal pain for the last few days. She rates her pain at 8/10 now. She has not taken anything for pain. She denies vaginal bleeding, discharge, UTI symptoms or fever. She has had nausea without vomiting. LMP 04/22/16.   OB History    Gravida Para Term Preterm AB Living   0 2 3   SAB TAB Ectopic Multiple Live Births   1 1 0 0 3      Past Medical History:  Diagnosis Date  . Medical history non-contributory   . No pertinent past medical history     Past Surgical History:  Procedure Laterality Date  . NO PAST SURGERIES      Family History  Problem Relation Age of Onset  . Diabetes Mother   . Hyperlipidemia Mother     Social History  Substance Use Topics  . Smoking status: Never Smoker  . Smokeless tobacco: Never Used  . Alcohol use No    Allergies:  Allergies  Allergen Reactions  . Pollen Extract Other (See Comments)    Migraines    No prescriptions prior to admission.    Review of Systems  Constitutional: Negative for fever.  Gastrointestinal: Positive for abdominal pain and nausea. Negative for constipation, diarrhea and vomiting.  Genitourinary: Negative for dysuria, frequency, urgency, vaginal bleeding and vaginal discharge.   Physical Exam   Blood pressure 120/72, pulse 81, temperature 98.5 F (36.9 C), resp. rate 18, height  (1.626 m), weight 182 lb (82.6 kg), last menstrual period 04/22/2016, unknown if currently breastfeeding.  Physical Exam  Nursing note and vitals reviewed. Constitutional: She is oriented to person, place, and time. She appears well-developed and well-nourished. No distress.  HENT:  Head: Normocephalic and atraumatic.   Cardiovascular: Normal rate.   Respiratory: Effort normal.  GI: Soft. She exhibits no distension and no mass. There is no tenderness. There is no rebound and no guarding.  Genitourinary: Uterus is not enlarged and not tender. Cervix exhibits no motion tenderness and no discharge. Right adnexum displays no mass and no tenderness. Left adnexum displays no mass and no tenderness. No bleeding in the vagina. Vaginal discharge (small, white) found.  Neurological: She is alert and oriented to person, place, and time.  Skin: Skin is warm and dry. No erythema.  Psychiatric: She has a normal mood and affect.  Dilation: Closed Effacement (%): Thick Cervical Position: Posterior Exam by:: Vonzella Nipple, PA-C   Results for orders placed or performed during the hospital encounter of 05/17/16 (from the past 24 hour(s))  Urinalysis, Routine w reflex microscopic     Status: Abnormal   Collection Time: 05/17/16 11:40 PM  Result Value Ref Range   Color, Urine YELLOW YELLOW   APPearance HAZY (A) CLEAR   Specific Gravity, Urine 1.029 1.005 - 1.030   pH 5.0 5.0 - 8.0   Glucose, UA NEGATIVE NEGATIVE mg/dL   Hgb urine dipstick NEGATIVE NEGATIVE   Bilirubin Urine NEGATIVE NEGATIVE   Ketones, ur NEGATIVE NEGATIVE mg/dL   Protein, ur NEGATIVE NEGATIVE mg/dL   Nitrite NEGATIVE NEGATIVE   Leukocytes, UA TRACE (A) NEGATIVE   RBC / HPF 0-5 0 -  5 RBC/hpf   WBC, UA 0-5 0 - 5 WBC/hpf   Bacteria, UA RARE (A) NONE SEEN   Squamous Epithelial / LPF 6-30 (A) NONE SEEN   Mucous PRESENT   Pregnancy, urine POC     Status: Abnormal   Collection Time: 05/17/16 11:44 PM  Result Value Ref Range   Preg Test, Ur POSITIVE (A) NEGATIVE  Wet prep, genital     Status: Abnormal   Collection Time: 05/17/16 11:50 PM  Result Value Ref Range   Yeast Wet Prep HPF POC NONE SEEN NONE SEEN   Trich, Wet Prep NONE SEEN NONE SEEN   Clue Cells Wet Prep HPF POC NONE SEEN NONE SEEN   WBC, Wet Prep HPF POC MODERATE (A) NONE SEEN   Sperm  NONE SEEN   CBC with Differential/Platelet     Status: Abnormal   Collection Time: 05/18/16 12:00 AM  Result Value Ref Range   WBC 8.8 4.0 - 10.5 K/uL   RBC 4.74 3.87 - 5.11 MIL/uL   Hemoglobin 12.3 12.0 - 15.0 g/dL   HCT 16.1 09.6 - 04.5 %   MCV 78.5 78.0 - 100.0 fL   MCH 25.9 (L) 26.0 - 34.0 pg   MCHC 33.1 30.0 - 36.0 g/dL   RDW 40.9 81.1 - 91.4 %   Platelets 317 150 - 400 K/uL   Neutrophils Relative % 58 %   Neutro Abs 5.1 1.7 - 7.7 K/uL   Lymphocytes Relative 35 %   Lymphs Abs 3.1 0.7 - 4.0 K/uL   Monocytes Relative 4 %   Monocytes Absolute 0.3 0.1 - 1.0 K/uL   Eosinophils Relative 3 %   Eosinophils Absolute 0.3 0.0 - 0.7 K/uL   Basophils Relative 0 %   Basophils Absolute 0.0 0.0 - 0.1 K/uL  hCG, quantitative, pregnancy     Status: Abnormal   Collection Time: 05/18/16 12:00 AM  Result Value Ref Range   hCG, Beta Chain, [name], S 92 (H) <5 mIU/mL    US Ob Comp Less 14 Wks  Result Date: 05/18/2016 CLINICAL DATA:  Initial evaluation for acute abdominal pain, currently pregnant. Beta HCG equals 92. EXAM: OBSTETRIC <14 WK [place] AND TRANSVAGINAL OB US TECHNIQUE: Both transabdominal and transvaginal ultrasound examinations were performed for complete evaluation of the gestation as well as the maternal uterus, adnexal regions, and pelvic cul-de-sac. Transvaginal technique was performed to assess early pregnancy. COMPARISON:  None. FINDINGS: Intrauterine gestational sac: Small cystic structure within the endometrial canal felt to most likely reflect an early gestational sac, although a possible small amount of fluid could also have this appearance. Yolk sac:  Not visualized. Embryo:  Not visualized. Cardiac Activity: N/A Heart Rate: N/A  bpm MSD: 2.3  mm   4 w   6  d Subchorionic hemorrhage:  None visualized. Maternal uterus/adnexae: Normal sonographic appearance of the right ovary. Probable small corpus luteal cyst noted within the left ovary. Small volume free fluid within the pelvis. Note  made of a retroflexed uterus. IMPRESSION: 1. Possible early intrauterine gestational sac, but no yolk sac, fetal pole, or cardiac activity yet visualized. Recommend follow-up quantitative B-HCG levels and follow-up US in 14 days to assess viability. This recommendation follows SRU consensus guidelines: Diagnostic Criteria for Nonviable Pregnancy Early in the First Trimester. Malva Limes Med 2013; 782:9562-13. 2. Left ovarian corpus luteal cyst with small volume free fluid within the pelvis. Electronically Signed   By: Rise Mu M.D.   On: 05/18/2016 01:32   US Ob  Transvaginal  Result Date: 05/18/2016 CLINICAL DATA:  Initial evaluation for acute abdominal pain, currently pregnant. Beta HCG equals 92. EXAM: OBSTETRIC <14 WK [place] AND TRANSVAGINAL OB US TECHNIQUE: Both transabdominal and transvaginal ultrasound examinations were performed for complete evaluation of the gestation as well as the maternal uterus, adnexal regions, and pelvic cul-de-sac. Transvaginal technique was performed to assess early pregnancy. COMPARISON:  None. FINDINGS: Intrauterine gestational sac: Small cystic structure within the endometrial canal felt to most likely reflect an early gestational sac, although a possible small amount of fluid could also have this appearance. Yolk sac:  Not visualized. Embryo:  Not visualized. Cardiac Activity: N/A Heart Rate: N/A  bpm MSD: 2.3  mm   4 w   6  d Subchorionic hemorrhage:  None visualized. Maternal uterus/adnexae: Normal sonographic appearance of the right ovary. Probable small corpus luteal cyst noted within the left ovary. Small volume free fluid within the pelvis. Note made of a retroflexed uterus. IMPRESSION: 1. Possible early intrauterine gestational sac, but no yolk sac, fetal pole, or cardiac activity yet visualized. Recommend follow-up quantitative B-HCG levels and follow-up US in 14 days to assess viability. This recommendation follows SRU consensus guidelines: Diagnostic Criteria  for Nonviable Pregnancy Early in the First Trimester. Malva Limes Med 2013; 102:7253-66. 2. Left ovarian corpus luteal cyst with small volume free fluid within the pelvis. Electronically Signed   By: Rise Mu M.D.   On: 05/18/2016 01:32    MAU Course  Procedures None  MDM +UPT UA, wet prep, GC/chlamydia, CBC, quant hCG, HIV, RPR and [place] today to rule out ectopic pregnancy Declines pain medication  Assessment and Plan  A: Pregnancy of unknown location Abdominal pain in pregnancy, first trimester   P:  Discharge home Ectopic precautions discussed Patient advised to follow-up with [facility] on Monday, April 16th for repeat labs  Patient may return to MAU as needed or if her condition were to change or worsen  Marny Lowenstein, PA-C  05/18/2016, 1:54 AM

## 2016-05-18 ENCOUNTER — Inpatient Hospital Stay (HOSPITAL_COMMUNITY): Payer: Medicaid Other

## 2016-05-18 DIAGNOSIS — R109 Unspecified abdominal pain: Secondary | ICD-10-CM | POA: Diagnosis not present

## 2016-05-18 DIAGNOSIS — O9989 Other specified diseases and conditions complicating pregnancy, childbirth and the puerperium: Secondary | ICD-10-CM

## 2016-05-18 LAB — CBC WITH DIFFERENTIAL/PLATELET
Basophils Absolute: 0 10*3/uL (ref 0.0–0.1)
Basophils Relative: 0 %
Eosinophils Absolute: 0.3 10*3/uL (ref 0.0–0.7)
Eosinophils Relative: 3 %
HCT: 37.2 % (ref 36.0–46.0)
HEMOGLOBIN: 12.3 g/dL (ref 12.0–15.0)
LYMPHS ABS: 3.1 10*3/uL (ref 0.7–4.0)
LYMPHS PCT: 35 %
MCH: 25.9 pg — AB (ref 26.0–34.0)
MCHC: 33.1 g/dL (ref 30.0–36.0)
MCV: 78.5 fL (ref 78.0–100.0)
MONOS PCT: 4 %
Monocytes Absolute: 0.3 10*3/uL (ref 0.1–1.0)
Neutro Abs: 5.1 10*3/uL (ref 1.7–7.7)
Neutrophils Relative %: 58 %
Platelets: 317 10*3/uL (ref 150–400)
RBC: 4.74 MIL/uL (ref 3.87–5.11)
RDW: 13.7 % (ref 11.5–15.5)
WBC: 8.8 10*3/uL (ref 4.0–10.5)

## 2016-05-18 LAB — WET PREP, GENITAL
CLUE CELLS WET PREP: NONE SEEN
Sperm: NONE SEEN
Trich, Wet Prep: NONE SEEN
Yeast Wet Prep HPF POC: NONE SEEN

## 2016-05-18 LAB — URINALYSIS, ROUTINE W REFLEX MICROSCOPIC
Bilirubin Urine: NEGATIVE
GLUCOSE, UA: NEGATIVE mg/dL
HGB URINE DIPSTICK: NEGATIVE
Ketones, ur: NEGATIVE mg/dL
NITRITE: NEGATIVE
PH: 5 (ref 5.0–8.0)
Protein, ur: NEGATIVE mg/dL
SPECIFIC GRAVITY, URINE: 1.029 (ref 1.005–1.030)

## 2016-05-18 LAB — HIV ANTIBODY (ROUTINE TESTING W REFLEX): HIV SCREEN 4TH GENERATION: NONREACTIVE

## 2016-05-18 LAB — RPR: RPR: NONREACTIVE

## 2016-05-18 LAB — HCG, QUANTITATIVE, PREGNANCY: HCG, BETA CHAIN, QUANT, S: 92 m[IU]/mL — AB (ref <5)

## 2016-05-18 NOTE — Discharge Instructions (Signed)
First Trimester of Pregnancy The first trimester of pregnancy is from week 1 until the end of week 13 (months 1 through 3). During this time, your baby will begin to develop inside you. At 6-8 weeks, the eyes and face are formed, and the heartbeat can be seen on ultrasound. At the end of 12 weeks, all the baby's organs are formed. Prenatal care is all the medical care you receive before the birth of your baby. Make sure you get good prenatal care and follow all of your doctor's instructions. Follow these instructions at home: Medicines  Take over-the-counter and prescription medicines only as told by your doctor. Some medicines are safe and some medicines are not safe during pregnancy.  Take a prenatal vitamin that contains at least 600 micrograms (mcg) of folic acid.  If you have trouble pooping (constipation), take medicine that will make your stool soft (stool softener) if your doctor approves. Eating and drinking  Eat regular, healthy meals.  Your doctor will tell you the amount of weight gain that is right for you.  Avoid raw meat and uncooked cheese.  If you feel sick to your stomach (nauseous) or throw up (vomit): ? Eat 4 or 5 small meals a day instead of 3 large meals. ? Try eating a few soda crackers. ? Drink liquids between meals instead of during meals.  To prevent constipation: ? Eat foods that are high in fiber, like fresh fruits and vegetables, whole grains, and beans. ? Drink enough fluids to keep your pee (urine) clear or pale yellow. Activity  Exercise only as told by your doctor. Stop exercising if you have cramps or pain in your lower belly (abdomen) or low back.  Do not exercise if it is too hot, too humid, or if you are in a place of great height (high altitude).  Try to avoid standing for long periods of time. Move your legs often if you must stand in one place for a long time.  Avoid heavy lifting.  Wear low-heeled shoes. Sit and stand up straight.  You  can have sex unless your doctor tells you not to. Relieving pain and discomfort  Wear a good support bra if your breasts are sore.  Take warm water baths (sitz baths) to soothe pain or discomfort caused by hemorrhoids. Use hemorrhoid cream if your doctor says it is okay.  Rest with your legs raised if you have leg cramps or low back pain.  If you have puffy, bulging veins (varicose veins) in your legs: ? Wear support hose or compression stockings as told by your doctor. ? Raise (elevate) your feet for 15 minutes, 3-4 times a day. ? Limit salt in your food. Prenatal care  Schedule your prenatal visits by the twelfth week of pregnancy.  Write down your questions. Take them to your prenatal visits.  Keep all your prenatal visits as told by your doctor. This is important. Safety  Wear your seat belt at all times when driving.  Make a list of emergency phone numbers. The list should include numbers for family, friends, the hospital, and police and fire departments. General instructions  Ask your doctor for a referral to a local prenatal class. Begin classes no later than at the start of month 6 of your pregnancy.  Ask for help if you need counseling or if you need help with nutrition. Your doctor can give you advice or tell you where to go for help.  Do not use hot tubs, steam rooms, or   saunas.  Do not douche or use tampons or scented sanitary pads.  Do not cross your legs for long periods of time.  Avoid all herbs and alcohol. Avoid drugs that are not approved by your doctor.  Do not use any tobacco products, including cigarettes, chewing tobacco, and electronic cigarettes. If you need help quitting, ask your doctor. You may get counseling or other support to help you quit.  Avoid cat litter boxes and soil used by cats. These carry germs that can cause birth defects in the baby and can cause a loss of your baby (miscarriage) or stillbirth.  Visit your dentist. At home, brush  your teeth with a soft toothbrush. Be gentle when you floss. Contact a doctor if:  You are dizzy.  You have mild cramps or pressure in your lower belly.  You have a nagging pain in your belly area.  You continue to feel sick to your stomach, you throw up, or you have watery poop (diarrhea).  You have a bad smelling fluid coming from your vagina.  You have pain when you pee (urinate).  You have increased puffiness (swelling) in your face, hands, legs, or ankles. Get help right away if:  You have a fever.  You are leaking fluid from your vagina.  You have spotting or bleeding from your vagina.  You have very bad belly cramping or pain.  You gain or lose weight rapidly.  You throw up blood. It may look like coffee grounds.  You are around people who have German measles, fifth disease, or chickenpox.  You have a very bad headache.  You have shortness of breath.  You have any kind of trauma, such as from a fall or a car accident. Summary  The first trimester of pregnancy is from week 1 until the end of week 13 (months 1 through 3).  To take care of yourself and your unborn baby, you will need to eat healthy meals, take medicines only if your doctor tells you to do so, and do activities that are safe for you and your baby.  Keep all follow-up visits as told by your doctor. This is important as your doctor will have to ensure that your baby is healthy and growing well. This information is not intended to replace advice given to you by your health care provider. Make sure you discuss any questions you have with your health care provider. Document Released: 07/10/2007 Document Revised: 01/30/2016 Document Reviewed: 01/30/2016 Elsevier Interactive Patient Education  2017 [employer]  

## 2016-05-20 ENCOUNTER — Telehealth: Payer: Self-pay | Admitting: General Practice

## 2016-05-20 ENCOUNTER — Ambulatory Visit: Payer: Medicaid Other | Admitting: General Practice

## 2016-05-20 DIAGNOSIS — O3680X0 Pregnancy with inconclusive fetal viability, not applicable or unspecified: Secondary | ICD-10-CM

## 2016-05-20 LAB — HCG, QUANTITATIVE, PREGNANCY: hCG, Beta Chain, Quant, S: 380 m[IU]/mL — ABNORMAL HIGH (ref <5)

## 2016-05-20 LAB — GC/CHLAMYDIA PROBE AMP (~~LOC~~) NOT AT ARMC
CHLAMYDIA, DNA PROBE: NEGATIVE
NEISSERIA GONORRHEA: NEGATIVE

## 2016-05-20 NOTE — Telephone Encounter (Signed)
Called patient with bhcg results & ultrasound appt. Patient verbalized understanding and asked about dating. Provided information to patient. Patient verbalized understanding & had no questions. Patient is aware of brief office appt to follow for ultrasound results

## 2016-05-20 NOTE — Progress Notes (Signed)
Patient here for stat bhcg. Patient denies bleeding or pain. Told patient we will contact her in 2 hours with results and updated plan of care. Patient verbalized understanding and provided number of [phone]. Per Dr Adrian Blackwater, patient had favorable rise in bhcg levels & needs follow up ultrasound in 2 weeks. Scheduled for 4/30 @ 10am. Will call patient with results

## 2016-06-03 ENCOUNTER — Ambulatory Visit: Payer: Medicaid Other

## 2016-06-03 ENCOUNTER — Ambulatory Visit (HOSPITAL_COMMUNITY)
Admission: RE | Admit: 2016-06-03 | Discharge: 2016-06-03 | Disposition: A | Discharge status: Home / Self Care | Payer: Medicaid Other | Source: Ambulatory Visit | Attending: Family Medicine | Admitting: Family Medicine

## 2016-06-03 DIAGNOSIS — O09891 Supervision of other high risk pregnancies, first trimester: Secondary | ICD-10-CM | POA: Diagnosis not present

## 2016-06-03 DIAGNOSIS — O209 Hemorrhage in early pregnancy, unspecified: Secondary | ICD-10-CM | POA: Diagnosis not present

## 2016-06-03 DIAGNOSIS — O3680X0 Pregnancy with inconclusive fetal viability, not applicable or unspecified: Secondary | ICD-10-CM

## 2016-06-03 DIAGNOSIS — Z3A01 Less than 8 weeks gestation of pregnancy: Secondary | ICD-10-CM | POA: Insufficient documentation

## 2016-06-03 DIAGNOSIS — Z712 Person consulting for explanation of examination or test findings: Secondary | ICD-10-CM

## 2016-06-03 NOTE — Progress Notes (Signed)
Patient presented to office today for u/s results. Patient has been given ultrasound results of viable pregnancy and advised to start prenatal care. She reports not taking any medication and advised to always contact her doctor before taking any medications. Letter of pregnancy verification has been given.

## 2016-07-08 ENCOUNTER — Ambulatory Visit (INDEPENDENT_AMBULATORY_CARE_PROVIDER_SITE_OTHER): Payer: Medicaid Other | Admitting: Certified Nurse Midwife

## 2016-07-08 ENCOUNTER — Encounter: Payer: Self-pay | Admitting: Certified Nurse Midwife

## 2016-07-08 ENCOUNTER — Other Ambulatory Visit (HOSPITAL_COMMUNITY)
Admission: RE | Admit: 2016-07-08 | Discharge: 2016-07-08 | Disposition: A | Discharge status: Home / Self Care | Payer: Medicaid Other | Source: Ambulatory Visit | Attending: Certified Nurse Midwife | Admitting: Certified Nurse Midwife

## 2016-07-08 VITALS — BP 105/68 | HR 83 | Wt 183.6 lb

## 2016-07-08 DIAGNOSIS — Z3A00 Weeks of gestation of pregnancy not specified: Secondary | ICD-10-CM | POA: Diagnosis not present

## 2016-07-08 DIAGNOSIS — Z3480 Encounter for supervision of other normal pregnancy, unspecified trimester: Secondary | ICD-10-CM | POA: Diagnosis present

## 2016-07-08 DIAGNOSIS — Z3481 Encounter for supervision of other normal pregnancy, first trimester: Secondary | ICD-10-CM

## 2016-07-08 DIAGNOSIS — O219 Vomiting of pregnancy, unspecified: Secondary | ICD-10-CM

## 2016-07-08 MED ORDER — PRENATE PIXIE 10-0.6-0.4-200 MG PO CAPS
1.0000 | ORAL_CAPSULE | Freq: Every day | ORAL | 12 refills | Status: DC
Start: 2016-07-08 — End: 2018-09-03

## 2016-07-08 MED ORDER — DOXYLAMINE-PYRIDOXINE 10-10 MG PO TBEC: DELAYED_RELEASE_TABLET | ORAL | 4 refills | Status: DC

## 2016-07-08 NOTE — Progress Notes (Signed)
Subjective:    [name] is being seen today for her first obstetrical visit.  This is a planned pregnancy. She is at 8730w5d gestation. Her obstetrical history is significant for previous ectopic pregnancy, has confirmed IUP with US late April this year. Relationship with FOB: significant other, living together. Patient does intend to breast feed. Pregnancy history fully reviewed.  The information documented in the HPI was reviewed and verified.  Menstrual History: OB History    Gravida Para Term Preterm AB Living   6 3 3  0 2 3   SAB TAB Ectopic Multiple Live Births   0 1 1 0 3       Patient's last menstrual period was 04/22/2016.    Past Medical History:  Diagnosis Date  . Anemia   . Medical history non-contributory   . No pertinent past medical history     Past Surgical History:  Procedure Laterality Date  . NO PAST SURGERIES       (Not in a hospital admission) Allergies  Allergen Reactions  . Pollen Extract Other (See Comments)    Migraines    Social History  Substance Use Topics  . Smoking status: Never Smoker  . Smokeless tobacco: Never Used  . Alcohol use No    Family History  Problem Relation Age of Onset  . Diabetes Mother   . Hyperlipidemia Mother   . Hypertension Mother      Review of Systems Constitutional: negative for weight loss Gastrointestinal: negative for vomiting Genitourinary:negative for genital lesions and vaginal discharge and dysuria Musculoskeletal:negative for back pain Behavioral/Psych: negative for abusive relationship, depression, illegal drug usage and tobacco use    Objective:    BP 105/68   Pulse 83   Wt 183 lb 9.6 oz (83.3 kg)   LMP 04/22/2016   BMI 31.51 kg/m  General Appearance:    Alert, cooperative, no distress, appears stated age  Head:    Normocephalic, without obvious abnormality, atraumatic  Eyes:    PERRL, conjunctiva/corneas clear, EOM's intact, fundi    benign, both eyes  Ears:    Normal TM's and external  ear canals, both ears  Nose:   Nares normal, septum midline, mucosa normal, no drainage    or sinus tenderness  Throat:   Lips, mucosa, and tongue normal; teeth and gums normal  Neck:   Supple, symmetrical, trachea midline, no adenopathy;    thyroid:  no enlargement/tenderness/nodules; no carotid   bruit or JVD  Back:     Symmetric, no curvature, ROM normal, no CVA tenderness  Lungs:     Clear to auscultation bilaterally, respirations unlabored  Chest Wall:    No tenderness or deformity   Heart:    Regular rate and rhythm, S1 and S2 normal, no murmur, rub   or gallop  Breast Exam:    No tenderness, masses, or nipple abnormality  Abdomen:     Soft, non-tender, bowel sounds active all four quadrants,    no masses, no organomegaly  Genitalia:    Normal female without lesion, discharge or tenderness  Extremities:   Extremities normal, atraumatic, no cyanosis or edema  Pulses:   2+ and symmetric all extremities  Skin:   Skin color, texture, turgor normal, no rashes or lesions  Lymph nodes:   Cervical, supraclavicular, and axillary nodes normal  Neurologic:   CNII-XII intact, normal strength, sensation and reflexes    throughout         Cervix:  Long, thick, closed and posterior.  FHR:  168 by doppler.      Lab Review Urine pregnancy test Labs reviewed yes Radiologic studies reviewed yes  Assessment & Plan    Pregnancy at [redacted]w[redacted]d weeks    1. Supervision of other normal pregnancy, antepartum       - Culture, OB Urine - Hemoglobinopathy evaluation - Hemoglobin A1c - Vitamin D (25 hydroxy) - Obstetric Panel, Including HIV - Varicella zoster antibody, IgG - MaterniT21 PLUS Core+SCA - Prenat-FeAsp-Meth-FA-DHA w/o A (PRENATE PIXIE) 10-0.6-0.4-200 MG CAPS; Take 1 tablet by mouth daily.  Dispense: 30 capsule; Refill: 12  2. Nausea and vomiting in pregnancy     - Doxylamine-Pyridoxine (DICLEGIS) 10-10 MG TBEC; Take 1 tablet with breakfast and lunch.  Take 2 tablets at bedtime.  Dispense:  100 tablet; Refill: 4    Exercise form completed and given to the patient, is not currently exercising worried about diastasis recti.   Prenatal vitamins.  Counseling provided regarding continued use of seat belts, cessation of alcohol consumption, smoking or use of illicit drugs; infection precautions i.e., influenza/TDAP immunizations, toxoplasmosis,CMV, parvovirus, listeria and varicella; workplace safety, exercise during pregnancy; routine dental care, safe medications, sexual activity, hot tubs, saunas, pools, travel, caffeine use, fish and methlymercury, potential toxins, hair treatments, varicose veins Weight gain recommendations per IOM guidelines reviewed: underweight/BMI< 18.5--> gain 28 - 40 lbs; normal weight/BMI 18.5 - 24.9--> gain 25 - 35 lbs; overweight/BMI 25 - 29.9--> gain 15 - 25 lbs; obese/BMI >30->gain  11 - 20 lbs Problem list reviewed and updated. FIRST/CF mutation testing/NIPT/QUAD SCREEN/fragile X/Ashkenazi Jewish population testing/Spinal muscular atrophy discussed: ordered. Role of ultrasound in pregnancy discussed; fetal survey: requested. Amniocentesis discussed: not indicated.  Meds ordered this encounter  Medications  . Prenatal Vit-Fe Fumarate-FA (MULTIVITAMIN-PRENATAL) 27-0.8 MG TABS tablet    Sig: Take 1 tablet by mouth daily at 12 noon.  . Doxylamine-Pyridoxine (DICLEGIS) 10-10 MG TBEC    Sig: Take 1 tablet with breakfast and lunch.  Take 2 tablets at bedtime.    Dispense:  100 tablet    Refill:  4  . Prenat-FeAsp-Meth-FA-DHA w/o A (PRENATE PIXIE) 10-0.6-0.4-200 MG CAPS    Sig: Take 1 tablet by mouth daily.    Dispense:  30 capsule    Refill:  12    Please process coupon: Rx BIN: [id number], RxPCN: [id number], RxGRP: [id number], RxID: [id number]  SUF: 01   Orders Placed This Encounter  Procedures  . Culture, OB Urine  . Hemoglobinopathy evaluation  . Vitamin D (25 hydroxy)  . Obstetric Panel, Including HIV  . Varicella zoster antibody, IgG  . MaterniT21 PLUS  Core+SCA    Order Specific Question:   Is the patient insulin dependent?    Answer:   No    Order Specific Question:   Please enter gestational age. This should be expressed as weeks AND days, i.e. 16w 6d. Enter weeks here. Enter days in next question.    Answer:   64    Order Specific Question:   Please enter gestational age. This should be expressed as weeks AND days, i.e. 16w 6d. Enter days here. Enter weeks in previous question.    Answer:   5    Order Specific Question:   How was gestational age calculated?    Answer:   LMP    Order Specific Question:   Please give the date of LMP OR Ultrasound OR Estimated date of delivery.    Answer:   01/29/2017    Order Specific Question:   Number of Fetuses (Type of  Pregnancy):    Answer:   1    Order Specific Question:   Indications for performing the test? (please choose all that apply):    Answer:   Routine screening    Order Specific Question:   Other Indications? (Y=Yes, N=No)    Answer:   N    Order Specific Question:   If this is a repeat specimen, please indicate the reason:    Answer:   Not indicated    Order Specific Question:   Please specify the patient's race: (C=White/Caucasion, B=Black, I=Native American, A=Asian, H=Hispanic, O=Other, U=Unknown)    Answer:   B    Order Specific Question:   Donor Egg - indicate if the egg was obtained from in vitro fertilization.    Answer:   N    Order Specific Question:   Age of Egg Donor.    Answer:   30    Order Specific Question:   Prior Down Syndrome/ONTD screening during current pregnancy.    Answer:   N    Order Specific Question:   Prior First Trimester Testing    Answer:   N    Order Specific Question:   Prior Second Trimester Testing    Answer:   N    Order Specific Question:   Family History of Neural Tube Defects    Answer:   N    Order Specific Question:   Prior Pregnancy with Down Syndrome    Answer:   N    Order Specific Question:   Please give the patient's weight (in  pounds)    Answer:   184  . Hemoglobin A1c    Follow up in 4 weeks. 50% of 30 min visit spent on counseling and coordination of care.

## 2016-07-08 NOTE — Progress Notes (Signed)
Patient is in the office for initial ob visit, complains of cramping, denies bleeding.

## 2016-07-09 LAB — CERVICOVAGINAL ANCILLARY ONLY
BACTERIAL VAGINITIS: NEGATIVE
Candida vaginitis: NEGATIVE
Chlamydia: NEGATIVE
NEISSERIA GONORRHEA: NEGATIVE
TRICH (WINDOWPATH): NEGATIVE

## 2016-07-09 LAB — HEMOGLOBIN A1C
ESTIMATED AVERAGE GLUCOSE: 108 mg/dL
HEMOGLOBIN A1C: 5.4 % (ref 4.8–5.6)

## 2016-07-09 LAB — CYTOLOGY - PAP: Diagnosis: NEGATIVE

## 2016-07-10 ENCOUNTER — Other Ambulatory Visit: Payer: Self-pay | Admitting: Certified Nurse Midwife

## 2016-07-10 DIAGNOSIS — R7989 Other specified abnormal findings of blood chemistry: Secondary | ICD-10-CM | POA: Insufficient documentation

## 2016-07-10 LAB — OBSTETRIC PANEL, INCLUDING HIV
ANTIBODY SCREEN: NEGATIVE
BASOS ABS: 0 10*3/uL (ref 0.0–0.2)
BASOS: 0 %
EOS (ABSOLUTE): 0.3 10*3/uL (ref 0.0–0.4)
EOS: 4 %
HEMATOCRIT: 39 % (ref 34.0–46.6)
HEP B S AG: NEGATIVE
HIV SCREEN 4TH GENERATION: NONREACTIVE
Hemoglobin: 12.2 g/dL (ref 11.1–15.9)
Immature Grans (Abs): 0 10*3/uL (ref 0.0–0.1)
Immature Granulocytes: 0 %
LYMPHS ABS: 2.7 10*3/uL (ref 0.7–3.1)
Lymphs: 32 %
MCH: 25.3 pg — AB (ref 26.6–33.0)
MCHC: 31.3 g/dL — ABNORMAL LOW (ref 31.5–35.7)
MCV: 81 fL (ref 79–97)
MONOCYTES: 10 %
Monocytes Absolute: 0.8 10*3/uL (ref 0.1–0.9)
NEUTROS ABS: 4.6 10*3/uL (ref 1.4–7.0)
Neutrophils: 54 %
PLATELETS: 358 10*3/uL (ref 150–379)
RBC: 4.83 x10E6/uL (ref 3.77–5.28)
RDW: 14.6 % (ref 12.3–15.4)
RH TYPE: POSITIVE
RPR: NONREACTIVE
Rubella Antibodies, IGG: 6.09 index (ref >0.99)
WBC: 8.5 10*3/uL (ref 3.4–10.8)

## 2016-07-10 LAB — HEMOGLOBINOPATHY EVALUATION
HEMOGLOBIN A2 QUANTITATION: 2.6 % (ref 1.8–3.2)
HEMOGLOBIN F QUANTITATION: 0 % (ref 0.0–2.0)
HGB A: 97.4 % (ref 96.4–98.8)
HGB C: 0 %
HGB S: 0 %
HGB VARIANT: 0 %

## 2016-07-10 LAB — VARICELLA ZOSTER ANTIBODY, IGG: Varicella zoster IgG: 917 index (ref >165)

## 2016-07-10 LAB — VITAMIN D 25 HYDROXY (VIT D DEFICIENCY, FRACTURES): VIT D 25 HYDROXY: 25.6 ng/mL — AB (ref 30.0–100.0)

## 2016-07-10 MED ORDER — VITAMIN D (ERGOCALCIFEROL) 1.25 MG (50000 UNIT) PO CAPS: 50000.0000 [IU] | ORAL_CAPSULE | ORAL | 2 refills | Status: DC

## 2016-07-12 LAB — URINE CULTURE, OB REFLEX

## 2016-07-12 LAB — CULTURE, OB URINE

## 2016-07-14 LAB — MATERNIT21 PLUS CORE+SCA
CHROMOSOME 13: NEGATIVE
CHROMOSOME 21: NEGATIVE
Chromosome 18: NEGATIVE
Y Chromosome: NOT DETECTED

## 2016-07-15 ENCOUNTER — Encounter: Payer: Self-pay | Admitting: Obstetrics and Gynecology

## 2016-07-15 ENCOUNTER — Other Ambulatory Visit: Payer: Self-pay | Admitting: Certified Nurse Midwife

## 2016-07-15 DIAGNOSIS — Z3480 Encounter for supervision of other normal pregnancy, unspecified trimester: Secondary | ICD-10-CM

## 2016-07-28 ENCOUNTER — Inpatient Hospital Stay (HOSPITAL_COMMUNITY)
Admission: AD | Admit: 2016-07-28 | Discharge: 2016-07-29 | Disposition: A | Discharge status: Home / Self Care | Payer: Medicaid Other | Source: Ambulatory Visit | Attending: Obstetrics & Gynecology | Admitting: Obstetrics & Gynecology

## 2016-07-28 ENCOUNTER — Encounter (HOSPITAL_COMMUNITY): Payer: Self-pay | Admitting: *Deleted

## 2016-07-28 DIAGNOSIS — Z3A13 13 weeks gestation of pregnancy: Secondary | ICD-10-CM | POA: Insufficient documentation

## 2016-07-28 DIAGNOSIS — O26891 Other specified pregnancy related conditions, first trimester: Secondary | ICD-10-CM | POA: Insufficient documentation

## 2016-07-28 DIAGNOSIS — N949 Unspecified condition associated with female genital organs and menstrual cycle: Secondary | ICD-10-CM

## 2016-07-28 DIAGNOSIS — R102 Pelvic and perineal pain: Secondary | ICD-10-CM | POA: Insufficient documentation

## 2016-07-28 NOTE — MAU Note (Signed)
Pt reports that [facility] was at [employer] today. She started  Cramping after riding one of the rides. Denies vaginal bleeding or gush of fluid.

## 2016-07-29 DIAGNOSIS — O26891 Other specified pregnancy related conditions, first trimester: Secondary | ICD-10-CM | POA: Diagnosis not present

## 2016-07-29 DIAGNOSIS — R102 Pelvic and perineal pain: Secondary | ICD-10-CM | POA: Diagnosis not present

## 2016-07-29 DIAGNOSIS — O26892 Other specified pregnancy related conditions, second trimester: Secondary | ICD-10-CM

## 2016-07-29 DIAGNOSIS — R109 Unspecified abdominal pain: Secondary | ICD-10-CM

## 2016-07-29 DIAGNOSIS — Z3A13 13 weeks gestation of pregnancy: Secondary | ICD-10-CM | POA: Diagnosis not present

## 2016-07-29 NOTE — MAU Provider Note (Signed)
History     CSN: [id number]  Arrival date and time: 07/28/16 2308   First Provider Initiated Contact with Patient 07/29/16 0017      Chief Complaint  Patient presents with  . Abdominal Cramping   [name] is a 26 y.o. [phone] at [redacted]w[redacted]d who presents today with cramping. She states that she was at [employer] all day, and afterward she had some cramping. She reports that the pain has improved at this time. She denies any VB.    Abdominal Cramping  This is a new problem. The current episode started yesterday. The onset quality is sudden. The problem occurs constantly. The problem has been gradually improving. The pain is located in the suprapubic region. The pain is at a severity of 4/10. The quality of the pain is cramping. The abdominal pain does not radiate. Pertinent negatives include no dysuria, fever, frequency, nausea or vomiting. Exacerbated by: Patient states that cramping started after spending the day at [employer] and riding rides.  The pain is relieved by nothing. She has tried nothing for the symptoms.      Past Medical History:  Diagnosis Date  . Anemia     Past Surgical History:  Procedure Laterality Date  . NO PAST SURGERIES      Family History  Problem Relation Age of Onset  . Diabetes Mother   . Hyperlipidemia Mother   . Hypertension Mother     Social History  Substance Use Topics  . Smoking status: Never Smoker  . Smokeless tobacco: Never Used  . Alcohol use No    Allergies:  Allergies  Allergen Reactions  . Pollen Extract Other (See Comments)    Migraines    Prescriptions Prior to Admission  Medication Sig Dispense Refill Last Dose  . Doxylamine-Pyridoxine (DICLEGIS) 10-10 MG TBEC Take 1 tablet with breakfast and lunch.  Take 2 tablets at bedtime. 100 tablet 4 Past Week at Unknown time  . Prenat-FeAsp-Meth-FA-DHA w/o A (PRENATE PIXIE) 10-0.6-0.4-200 MG CAPS Take 1 tablet by mouth daily. 30 capsule 12 Past Week at Unknown time  . Vitamin D,  Ergocalciferol, (DRISDOL) 50000 units CAPS capsule Take 1 capsule (50,000 Units total) by mouth every 7 (seven) days. 30 capsule 2 Past Week at Unknown time  . Prenatal Vit-Fe Fumarate-FA (MULTIVITAMIN-PRENATAL) 27-0.8 MG TABS tablet Take 1 tablet by mouth daily at 12 noon.   Taking    Review of Systems  Constitutional: Negative for chills and fever.  Gastrointestinal: Negative for nausea and vomiting.  Genitourinary: Negative for dysuria, frequency, urgency, vaginal bleeding and vaginal discharge.   Physical Exam   Blood pressure 116/60, pulse 77, resp. rate 18, height 5\' 4"  (1.626 m), weight 187 lb (84.8 kg), last menstrual period 04/22/2016, unknown if currently breastfeeding.  Physical Exam  Nursing note and vitals reviewed. Constitutional: She is oriented to person, place, and time. She appears well-developed and well-nourished. No distress.  HENT:  Head: Normocephalic.  Cardiovascular: Normal rate.   Respiratory: Effort normal.  Genitourinary:  Genitourinary Comments: FHT 158 with doppler   Neurological: She is alert and oriented to person, place, and time.  Skin: Skin is warm and dry.  Psychiatric: She has a normal mood and affect.    MAU Course  Procedures  MDM   Assessment and Plan   1. [redacted] weeks gestation of pregnancy   2. Round ligament pain    DC home Comfort measures reviewed  2nd Trimester precautions  RX: none  Return to MAU as needed FU with OB  as planned  Follow-up Information    [facility] Follow up.   Specialty:  Obstetrics and Gynecology Contact information: [address], [address] [place] [id number] [phone]         Thressa ShellerHeather Morrissa Shein 07/29/16 12:24 AM

## 2016-07-29 NOTE — Discharge Instructions (Signed)

## 2016-08-05 ENCOUNTER — Ambulatory Visit (INDEPENDENT_AMBULATORY_CARE_PROVIDER_SITE_OTHER): Payer: Medicaid Other | Admitting: Certified Nurse Midwife

## 2016-08-05 ENCOUNTER — Encounter: Payer: Self-pay | Admitting: Certified Nurse Midwife

## 2016-08-05 VITALS — BP 115/70 | HR 89 | Wt 184.6 lb

## 2016-08-05 DIAGNOSIS — R7989 Other specified abnormal findings of blood chemistry: Secondary | ICD-10-CM

## 2016-08-05 DIAGNOSIS — Z3480 Encounter for supervision of other normal pregnancy, unspecified trimester: Secondary | ICD-10-CM

## 2016-08-05 DIAGNOSIS — Z3482 Encounter for supervision of other normal pregnancy, second trimester: Secondary | ICD-10-CM

## 2016-08-05 DIAGNOSIS — E559 Vitamin D deficiency, unspecified: Secondary | ICD-10-CM

## 2016-08-05 NOTE — Progress Notes (Signed)
   PRENATAL VISIT NOTE  Subjective:  [name] is a 26 y.o. [phone] at 794w5d being seen today for ongoing prenatal care.  She is currently monitored for the following issues for this low-risk pregnancy and has Supervision of other normal pregnancy, antepartum and Low vitamin D level on her problem list.  Patient reports nausea, no bleeding, no contractions, no cramping, no leaking and vomiting.  Contractions: Not present. Vag. Bleeding: None.  Movement: Present. Denies leaking of fluid.   The following portions of the patient's history were reviewed and updated as appropriate: allergies, current medications, past family history, past medical history, past social history, past surgical history and problem list. Problem list updated.  Objective:   Vitals:   08/05/16 0922  BP: 115/70  Pulse: 89  Weight: 184 lb 9.6 oz (83.7 kg)    Fetal Status: Fetal Heart Rate (bpm): 158   Movement: Present     General:  Alert, oriented and cooperative. Patient is in no acute distress.  Skin: Skin is warm and dry. No rash noted.   Cardiovascular: Normal heart rate noted  Respiratory: Normal respiratory effort, no problems with respiration noted  Abdomen: Soft, gravid, appropriate for gestational age. Pain/Pressure: Present     Pelvic:  Cervical exam deferred        Extremities: Normal range of motion.  Edema: None  Mental Status: Normal mood and affect. Normal behavior. Normal judgment and thought content.   Assessment and Plan:  Pregnancy: [id number] at 347w5d  1. Supervision of other normal pregnancy, antepartum     Desires to continue Diclegis.  Patient signed up for Babyscripts.  - US MFM OB COMP + 14 WK; Future  2. Low vitamin D level     Taking vitamin D.   Preterm labor symptoms and general obstetric precautions including but not limited to vaginal bleeding, contractions, leaking of fluid and fetal movement were reviewed in detail with the patient. Please refer to After Visit Summary for  other counseling recommendations.  Return in about 4 weeks (around 09/02/2016) for ROB.   Roe Coombsachelle A Evaristo Tsuda, CNM

## 2016-08-05 NOTE — Progress Notes (Signed)
Patient did have some pain yesterday- lasted a few hours-she is struggling to drink water.

## 2016-08-16 ENCOUNTER — Other Ambulatory Visit: Payer: Self-pay | Admitting: Certified Nurse Midwife

## 2016-08-16 DIAGNOSIS — O219 Vomiting of pregnancy, unspecified: Secondary | ICD-10-CM

## 2016-08-16 MED ORDER — ONDANSETRON HCL 8 MG PO TABS: 8.0000 mg | ORAL_TABLET | Freq: Three times a day (TID) | ORAL | 2 refills | Status: DC | PRN

## 2016-09-02 ENCOUNTER — Ambulatory Visit (INDEPENDENT_AMBULATORY_CARE_PROVIDER_SITE_OTHER): Payer: Medicaid Other | Admitting: Certified Nurse Midwife

## 2016-09-02 ENCOUNTER — Encounter: Payer: Self-pay | Admitting: Certified Nurse Midwife

## 2016-09-02 VITALS — BP 102/61 | HR 89 | Wt 186.8 lb

## 2016-09-02 DIAGNOSIS — R7989 Other specified abnormal findings of blood chemistry: Secondary | ICD-10-CM

## 2016-09-02 DIAGNOSIS — Z3480 Encounter for supervision of other normal pregnancy, unspecified trimester: Secondary | ICD-10-CM

## 2016-09-02 DIAGNOSIS — Z3482 Encounter for supervision of other normal pregnancy, second trimester: Secondary | ICD-10-CM

## 2016-09-02 DIAGNOSIS — E559 Vitamin D deficiency, unspecified: Secondary | ICD-10-CM

## 2016-09-02 NOTE — Progress Notes (Signed)
Patient reports good fetal movement, complains of occasional pressure, denies contractions and bleeding.

## 2016-09-02 NOTE — Progress Notes (Signed)
   PRENATAL VISIT NOTE  Subjective:  [name] is a 26 y.o. [phone] at 6768w5d being seen today for ongoing prenatal care.  She is currently monitored for the following issues for this low-risk pregnancy and has Supervision of other normal pregnancy, antepartum and Low vitamin D level on her problem list.  Patient reports no complaints.  Contractions: Not present. Vag. Bleeding: None.  Movement: Present. Denies leaking of fluid.   The following portions of the patient's history were reviewed and updated as appropriate: allergies, current medications, past family history, past medical history, past social history, past surgical history and problem list. Problem list updated.  Objective:   Vitals:   09/02/16 1554  BP: 102/61  Pulse: 89  Weight: 186 lb 12.8 oz (84.7 kg)    Fetal Status: Fetal Heart Rate (bpm): 154   Movement: Present     General:  Alert, oriented and cooperative. Patient is in no acute distress.  Skin: Skin is warm and dry. No rash noted.   Cardiovascular: Normal heart rate noted  Respiratory: Normal respiratory effort, no problems with respiration noted  Abdomen: Soft, gravid, appropriate for gestational age.  Pain/Pressure: Present     Pelvic: Cervical exam deferred        Extremities: Normal range of motion.  Edema: None  Mental Status:  Normal mood and affect. Normal behavior. Normal judgment and thought content.   Assessment and Plan:  Pregnancy: [id number] at 7368w5d  1. Supervision of other normal pregnancy, antepartum    - AFP TETRA  2. Low vitamin D level     Taking weekly vitamin D  Preterm labor symptoms and general obstetric precautions including but not limited to vaginal bleeding, contractions, leaking of fluid and fetal movement were reviewed in detail with the patient. Please refer to After Visit Summary for other counseling recommendations.  Return for babyscripts: 28 weeks , 2 hr OGTT.   Roe Coombsachelle A Tullio Chausse, CNM

## 2016-09-06 ENCOUNTER — Other Ambulatory Visit: Payer: Self-pay | Admitting: Certified Nurse Midwife

## 2016-09-06 ENCOUNTER — Ambulatory Visit (HOSPITAL_COMMUNITY)
Admission: RE | Admit: 2016-09-06 | Discharge: 2016-09-06 | Disposition: A | Discharge status: Home / Self Care | Payer: Medicaid Other | Source: Ambulatory Visit | Attending: Certified Nurse Midwife | Admitting: Certified Nurse Midwife

## 2016-09-06 DIAGNOSIS — Z3689 Encounter for other specified antenatal screening: Secondary | ICD-10-CM

## 2016-09-06 DIAGNOSIS — Z3480 Encounter for supervision of other normal pregnancy, unspecified trimester: Secondary | ICD-10-CM

## 2016-09-06 DIAGNOSIS — Z3A19 19 weeks gestation of pregnancy: Secondary | ICD-10-CM

## 2016-09-06 LAB — AFP, SERUM, OPEN SPINA BIFIDA
AFP MoM: 1.58
AFP Value: 71.6 ng/mL
GEST. AGE ON COLLECTION DATE: 18.7 wk
MATERNAL AGE AT EDD: 27.6 a
OSBR RISK 1 IN: 4424
TEST RESULTS AFP: NEGATIVE
Weight: 186 [lb_av]

## 2016-10-08 ENCOUNTER — Telehealth: Payer: Self-pay

## 2016-10-08 NOTE — Telephone Encounter (Signed)
Pt called wanting to know if there is an alternative for the 2 hour glucose test. 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

## 2016-10-09 NOTE — Telephone Encounter (Signed)
Pt would like to do the one hour OGTT with glucola. Pt is already scheduled

## 2016-10-09 NOTE — Telephone Encounter (Signed)
She can do the one hour OGTT with either the glucola or jelly beans. Jelly Beans: one bag classic Brachs (only)  Thank you.  Rachelle

## 2016-10-09 NOTE — Telephone Encounter (Signed)
Left message for pt to return call.

## 2016-10-15 ENCOUNTER — Telehealth: Payer: Self-pay | Admitting: *Deleted

## 2016-10-15 NOTE — Telephone Encounter (Signed)
Pt called to office stating that she was having stomach and back pain.  Pt reports +FM.  Attempt to return call. LM on VM making pt aware to contact office if she would like to be seen for problem and/or call office to speak with triage.

## 2016-11-12 ENCOUNTER — Encounter: Payer: Self-pay | Admitting: Certified Nurse Midwife

## 2016-11-12 ENCOUNTER — Other Ambulatory Visit: Payer: Self-pay

## 2016-11-14 ENCOUNTER — Encounter: Payer: Self-pay | Admitting: Certified Nurse Midwife

## 2016-11-14 ENCOUNTER — Other Ambulatory Visit: Payer: Medicaid Other

## 2016-11-14 ENCOUNTER — Ambulatory Visit (INDEPENDENT_AMBULATORY_CARE_PROVIDER_SITE_OTHER): Payer: Medicaid Other | Admitting: Certified Nurse Midwife

## 2016-11-14 VITALS — BP 116/75 | HR 93 | Wt 191.0 lb

## 2016-11-14 DIAGNOSIS — Z3483 Encounter for supervision of other normal pregnancy, third trimester: Secondary | ICD-10-CM

## 2016-11-14 DIAGNOSIS — R7989 Other specified abnormal findings of blood chemistry: Secondary | ICD-10-CM

## 2016-11-14 DIAGNOSIS — Z3480 Encounter for supervision of other normal pregnancy, unspecified trimester: Secondary | ICD-10-CM

## 2016-11-14 NOTE — Progress Notes (Signed)
   PRENATAL VISIT NOTE  Subjective:  [name] is a 26 y.o. [phone] at [redacted]w[redacted]d being seen today for ongoing prenatal care.  She is currently monitored for the following issues for this low-risk pregnancy and has Supervision of other normal pregnancy, antepartum and Low vitamin D level on her problem list.  Patient reports no complaints.  Contractions: Not present. Vag. Bleeding: None.  Movement: Present. Denies leaking of fluid.   The following portions of the patient's history were reviewed and updated as appropriate: allergies, current medications, past family history, past medical history, past social history, past surgical history and problem list. Problem list updated.  Objective:   Vitals:   11/14/16 0917  BP: 116/75  Pulse: 93  Weight: 191 lb (86.6 kg)    Fetal Status: Fetal Heart Rate (bpm): 150; doppler Fundal Height: 27 cm Movement: Present     General:  Alert, oriented and cooperative. Patient is in no acute distress.  Skin: Skin is warm and dry. No rash noted.   Cardiovascular: Normal heart rate noted  Respiratory: Normal respiratory effort, no problems with respiration noted  Abdomen: Soft, gravid, appropriate for gestational age.  Pain/Pressure: Present     Pelvic: Cervical exam deferred        Extremities: Normal range of motion.     Mental Status:  Normal mood and affect. Normal behavior. Normal judgment and thought content.   Assessment and Plan:  Pregnancy: [id number] at [redacted]w[redacted]d  1. Supervision of other normal pregnancy, antepartum     Doing well.  - Glucose Tolerance, 2 Hours w/1 Hour - CBC - HIV antibody - RPR  2. Low vitamin D level     Taking weekly vitamin D  Preterm labor symptoms and general obstetric precautions including but not limited to vaginal bleeding, contractions, leaking of fluid and fetal movement were reviewed in detail with the patient. Please refer to After Visit Summary for other counseling recommendations.  Return in about 4 weeks  (around 12/12/2016) for ROB.   Roe Coombs, CNM

## 2016-11-15 LAB — CBC
HEMATOCRIT: 35.2 % (ref 34.0–46.6)
Hemoglobin: 11.4 g/dL (ref 11.1–15.9)
MCH: 26.4 pg — ABNORMAL LOW (ref 26.6–33.0)
MCHC: 32.4 g/dL (ref 31.5–35.7)
MCV: 82 fL (ref 79–97)
PLATELETS: 306 10*3/uL (ref 150–379)
RBC: 4.32 x10E6/uL (ref 3.77–5.28)
RDW: 14.5 % (ref 12.3–15.4)
WBC: 8.6 10*3/uL (ref 3.4–10.8)

## 2016-11-15 LAB — GLUCOSE TOLERANCE, 2 HOURS W/ 1HR
GLUCOSE, FASTING: 73 mg/dL (ref 65–91)
Glucose, 1 hour: 89 mg/dL (ref 65–179)
Glucose, 2 hour: 97 mg/dL (ref 65–152)

## 2016-11-15 LAB — RPR: RPR: NONREACTIVE

## 2016-11-15 LAB — HIV ANTIBODY (ROUTINE TESTING W REFLEX): HIV Screen 4th Generation wRfx: NONREACTIVE

## 2016-11-18 ENCOUNTER — Other Ambulatory Visit: Payer: Self-pay | Admitting: Certified Nurse Midwife

## 2016-11-18 DIAGNOSIS — Z3480 Encounter for supervision of other normal pregnancy, unspecified trimester: Secondary | ICD-10-CM

## 2016-11-24 ENCOUNTER — Emergency Department (INDEPENDENT_AMBULATORY_CARE_PROVIDER_SITE_OTHER)
Admission: EM | Admit: 2016-11-24 | Discharge: 2016-11-24 | Disposition: A | Discharge status: Home / Self Care | Payer: Medicaid Other | Source: Home / Self Care | Attending: Family Medicine | Admitting: Family Medicine

## 2016-11-24 ENCOUNTER — Encounter: Payer: Self-pay | Admitting: Emergency Medicine

## 2016-11-24 DIAGNOSIS — K05219 Aggressive periodontitis, localized, unspecified severity: Secondary | ICD-10-CM | POA: Diagnosis not present

## 2016-11-24 DIAGNOSIS — K029 Dental caries, unspecified: Secondary | ICD-10-CM

## 2016-11-24 MED ORDER — AMOXICILLIN 500 MG PO CAPS: 500.0000 mg | ORAL_CAPSULE | Freq: Three times a day (TID) | ORAL | 0 refills | Status: DC

## 2016-11-24 NOTE — ED Provider Notes (Signed)
[facility]    CSN: [id number] Arrival date & time: 11/24/16  1656     History   Chief Complaint Chief Complaint  Patient presents with  . Dental Pain    HPI [name] is a 26 y.o. female.   HPI  [name] is a 26 y.o. female presenting to UC with c/o Right lower dental pain for 3 days that is aching and throbbing.  Pain is 8/10 at this time.  Hx of similar pain from same tooth. Her dentist has advised her she needs to see an oral surgeon to get the tooth pulled.  Pt is about [redacted] weeks pregnant.  Denies fever, chills, n/v/d.    Past Medical History:  Diagnosis Date  . Anemia     Patient Active Problem List   Diagnosis Date Noted  . Low vitamin D level 07/10/2016  . Supervision of other normal pregnancy, antepartum 07/08/2016    Past Surgical History:  Procedure Laterality Date  . NO PAST SURGERIES      OB History    Gravida Para Term Preterm AB Living   6 3 3  0 2 3   SAB TAB Ectopic Multiple Live Births   0 1 1 0 3       Home Medications    Prior to Admission medications   Medication Sig Start Date End Date Taking? Authorizing Provider  amoxicillin (AMOXIL) 500 MG capsule Take 1 capsule (500 mg total) by mouth 3 (three) times daily. 11/24/16   Lurene Shadow, PA-C  Prenat-FeAsp-Meth-FA-DHA w/o A (PRENATE PIXIE) 10-0.6-0.4-200 MG CAPS Take 1 tablet by mouth daily. 07/08/16   Orvilla Cornwall A, CNM  Vitamin D, Ergocalciferol, (DRISDOL) 50000 units CAPS capsule Take 1 capsule (50,000 Units total) by mouth every 7 (seven) days. 07/10/16   Roe Coombs, CNM    Family History Family History  Problem Relation Age of Onset  . Diabetes Mother   . Hyperlipidemia Mother   . Hypertension Mother     Social History Social History  Substance Use Topics  . Smoking status: Never Smoker  . Smokeless tobacco: Never Used  . Alcohol use No     Allergies   Pollen extract   Review of Systems Review of Systems  Constitutional: Negative for  chills and fever.  HENT: Positive for dental problem. Negative for sore throat.   Gastrointestinal: Negative for diarrhea, nausea and vomiting.  Neurological: Negative for dizziness, light-headedness and headaches.     Physical Exam Triage Vital Signs ED Triage Vitals  Enc Vitals Group     BP 11/24/16 1719 108/66     Pulse Rate 11/24/16 1719 87     Resp 11/24/16 1719 16     Temp 11/24/16 1719 98.6 F (37 C)     Temp Source 11/24/16 1719 Oral     SpO2 11/24/16 1719 99 %     Weight 11/24/16 1720 194 lb (88 kg)     Height 11/24/16 1720 5\' 4"  (1.626 m)     Head Circumference --      Peak Flow --      Pain Score 11/24/16 1720 8     Pain Loc --      Pain Edu? --      Excl. in GC? --    No data found.   Updated Vital Signs BP 108/66 (BP Location: Left Arm)   Pulse 87   Temp 98.6 F (37 C) (Oral)   Resp 16   Ht 5\' 4"  (  1.626 m)   Wt 194 lb (88 kg)   LMP 04/22/2016   SpO2 99%   BMI 33.30 kg/m   Visual Acuity Right Eye Distance:   Left Eye Distance:   Bilateral Distance:    Right Eye Near:   Left Eye Near:    Bilateral Near:     Physical Exam  Constitutional: She is oriented to person, place, and time. She appears well-developed and well-nourished. No distress.  HENT:  Head: Normocephalic and atraumatic.  Mouth/Throat: Uvula is midline, oropharynx is clear and moist and mucous membranes are normal. Dental abscesses present.    Right lower gingiva: mild erythema, edema, fluctuance and tenderness. No active bleeding or drainage. Teeth in tact.   Eyes: EOM are normal.  Neck: Normal range of motion.  Cardiovascular: Normal rate.   Pulmonary/Chest: Effort normal.  Musculoskeletal: Normal range of motion.  Neurological: She is alert and oriented to person, place, and time.  Skin: Skin is warm and dry. She is not diaphoretic.  Psychiatric: She has a normal mood and affect. Her behavior is normal.  Nursing note and vitals reviewed.    UC Treatments / Results    Labs (all labs ordered are listed, but only abnormal results are displayed) Labs Reviewed - No data to display  EKG  EKG Interpretation None       Radiology No results found.  Procedures Procedures (including critical care time)  Medications Ordered in UC Medications - No data to display   Initial Impression / Assessment and Plan / UC Course  I have reviewed the triage vital signs and the nursing notes.  Pertinent labs & imaging results that were available during my care of the patient were reviewed by me and considered in my medical decision making (see chart for details).     Hx and exam c/w dental abscess Will treat with amoxicillin Pt may have acetaminophen for pain Encouraged warm compresses and saltwater gargles.   Final Clinical Impressions(s) / UC Diagnoses   Final diagnoses:  Gingival abscess  Pain due to dental caries    New Prescriptions Discharge Medication List as of 11/24/2016  6:04 PM    START taking these medications   Details  amoxicillin (AMOXIL) 500 MG capsule Take 1 capsule (500 mg total) by mouth 3 (three) times daily., Starting Sun 11/24/2016, Normal         Controlled Substance Prescriptions [place] Controlled Substance Registry consulted? Not Applicable   Rolla Platehelps, Kipling Graser O, PA-C 11/24/16 1807

## 2016-11-24 NOTE — ED Triage Notes (Signed)
C/o tooth pain x 2 days

## 2016-12-09 ENCOUNTER — Inpatient Hospital Stay (HOSPITAL_COMMUNITY)
Admission: AD | Admit: 2016-12-09 | Discharge: 2016-12-09 | Disposition: A | Discharge status: Home / Self Care | Payer: Medicaid Other | Source: Ambulatory Visit | Attending: Obstetrics & Gynecology | Admitting: Obstetrics & Gynecology

## 2016-12-09 ENCOUNTER — Encounter (HOSPITAL_COMMUNITY): Payer: Self-pay | Admitting: *Deleted

## 2016-12-09 ENCOUNTER — Telehealth: Payer: Self-pay | Admitting: *Deleted

## 2016-12-09 DIAGNOSIS — Z3A32 32 weeks gestation of pregnancy: Secondary | ICD-10-CM | POA: Diagnosis not present

## 2016-12-09 DIAGNOSIS — O479 False labor, unspecified: Secondary | ICD-10-CM

## 2016-12-09 DIAGNOSIS — R102 Pelvic and perineal pain: Secondary | ICD-10-CM | POA: Diagnosis present

## 2016-12-09 DIAGNOSIS — O26893 Other specified pregnancy related conditions, third trimester: Secondary | ICD-10-CM | POA: Diagnosis present

## 2016-12-09 DIAGNOSIS — O4700 False labor before 37 completed weeks of gestation, unspecified trimester: Secondary | ICD-10-CM

## 2016-12-09 LAB — URINALYSIS, ROUTINE W REFLEX MICROSCOPIC
Bilirubin Urine: NEGATIVE
GLUCOSE, UA: NEGATIVE mg/dL
HGB URINE DIPSTICK: NEGATIVE
KETONES UR: NEGATIVE mg/dL
Leukocytes, UA: NEGATIVE
Nitrite: NEGATIVE
PH: 5 (ref 5.0–8.0)
PROTEIN: NEGATIVE mg/dL
Specific Gravity, Urine: 1.018 (ref 1.005–1.030)

## 2016-12-09 MED ORDER — NIFEDIPINE ER OSMOTIC RELEASE 30 MG PO TB24: 30.0000 mg | ORAL_TABLET | Freq: Every day | ORAL | 0 refills | Status: DC

## 2016-12-09 NOTE — MAU Provider Note (Signed)
History     CSN: [id number]  Arrival date and time: 12/09/16 1229   First Provider Initiated Contact with Patient 12/09/16 1309      Chief Complaint  Patient presents with  . Vaginal Pain  . Abdominal Pain   26 yo [id number] at 5654w5d presents with lower abdominal discomfort and sharp, shooting pain in the vagina since last night. Pain comes and goes. Nothing makes better or worse. No associated vagina bleeding or vaginal discharge.     OB History    Gravida Para Term Preterm AB Living   6 3 3  0 2 3   SAB TAB Ectopic Multiple Live Births   0 1 1 0 3      Past Medical History:  Diagnosis Date  . Anemia     Past Surgical History:  Procedure Laterality Date  . NO PAST SURGERIES      Family History  Problem Relation Age of Onset  . Diabetes Mother   . Hyperlipidemia Mother   . Hypertension Mother     Social History   Tobacco Use  . Smoking status: Never Smoker  . Smokeless tobacco: Never Used  Substance Use Topics  . Alcohol use: No  . Drug use: No    Allergies:  Allergies  Allergen Reactions  . Pollen Extract Other (See Comments)    Migraines    Medications Prior to Admission  Medication Sig Dispense Refill Last Dose  . amoxicillin (AMOXIL) 500 MG capsule Take 1 capsule (500 mg total) by mouth 3 (three) times daily. 21 capsule 0   . Prenat-FeAsp-Meth-FA-DHA w/o A (PRENATE PIXIE) 10-0.6-0.4-200 MG CAPS Take 1 tablet by mouth daily. 30 capsule 12 Taking  . Vitamin D, Ergocalciferol, (DRISDOL) 50000 units CAPS capsule Take 1 capsule (50,000 Units total) by mouth every 7 (seven) days. 30 capsule 2 Taking    Review of Systems  Constitutional: Negative for activity change and appetite change.  HENT: Negative for congestion and dental problem.   Eyes: Negative for discharge and itching.  Respiratory: Negative for apnea and chest tightness.   Cardiovascular: Negative for chest pain and leg swelling.  Gastrointestinal: Positive for abdominal pain. Negative  for vomiting.  Endocrine: Negative for cold intolerance and heat intolerance.  Genitourinary: Negative for vaginal bleeding and vaginal discharge.  Musculoskeletal: Negative for arthralgias and back pain.  Neurological: Negative for dizziness and headaches.  Psychiatric/Behavioral: Negative for agitation and behavioral problems.   Physical Exam   Blood pressure (!) 111/52, pulse 87, temperature 98.2 F (36.8 C), resp. rate 16, weight 197 lb (89.4 kg), last menstrual period 04/22/2016, unknown if currently breastfeeding.  Physical Exam  Constitutional: She is oriented to person, place, and time. She appears well-developed and well-nourished.  HENT:  Head: Normocephalic and atraumatic.  Eyes: Conjunctivae are normal. Pupils are equal, round, and reactive to light.  Neck: Normal range of motion. Neck supple.  Cardiovascular: Normal rate and intact distal pulses.  Respiratory: Effort normal. No respiratory distress.  GI: Soft. There is no tenderness.  Genitourinary:  Genitourinary Comments: Cervix: closed/thick/high  Musculoskeletal: Normal range of motion. She exhibits no edema.  Neurological: She is alert and oriented to person, place, and time.  Skin: Skin is warm and dry.  Psychiatric: She has a normal mood and affect. Her behavior is normal.    MAU Course  Procedures  MDM Cervix is closed. Patient is contracting on toco q10 min. Discussed with Dr. Macon LargeAnyanwu and will discharge home with procardia xl 30 mg daily. Follow up  outpatient.  Assessment and Plan  1. Pregnancy at [redacted] weeks gestation- follow up outpatient 2. Preterm contractions- prescribe procardia. Preterm labor precautions given. Follow up outpatient.  Filicia Scogin 12/09/2016, 1:43 PM

## 2016-12-09 NOTE — Telephone Encounter (Signed)
Pt called to office stating she was having a lot of cervical, pelvic and low back pain since yesterday. Pt states she would like appt if possible, she is 32 weeks.  Attempt to contact pt.  LM on VM that she may call office and be seen today if any open appts. If pain is severe, [facility]. Advised to contact office once msg received.

## 2016-12-09 NOTE — Discharge Instructions (Signed)

## 2016-12-09 NOTE — MAU Note (Signed)
Pt presents to MAU with complaints of lower abdominal cramping and vaginal pain that started last night. Denies any VB or abnormal discharge

## 2016-12-12 ENCOUNTER — Encounter: Payer: Self-pay | Admitting: Certified Nurse Midwife

## 2016-12-12 ENCOUNTER — Ambulatory Visit (INDEPENDENT_AMBULATORY_CARE_PROVIDER_SITE_OTHER): Payer: Medicaid Other | Admitting: Certified Nurse Midwife

## 2016-12-12 VITALS — BP 107/71 | HR 85 | Wt 197.0 lb

## 2016-12-12 DIAGNOSIS — Z3483 Encounter for supervision of other normal pregnancy, third trimester: Secondary | ICD-10-CM

## 2016-12-12 DIAGNOSIS — O26899 Other specified pregnancy related conditions, unspecified trimester: Secondary | ICD-10-CM

## 2016-12-12 DIAGNOSIS — O26893 Other specified pregnancy related conditions, third trimester: Secondary | ICD-10-CM

## 2016-12-12 DIAGNOSIS — R7989 Other specified abnormal findings of blood chemistry: Secondary | ICD-10-CM

## 2016-12-12 DIAGNOSIS — R102 Pelvic and perineal pain: Secondary | ICD-10-CM

## 2016-12-12 DIAGNOSIS — N949 Unspecified condition associated with female genital organs and menstrual cycle: Secondary | ICD-10-CM

## 2016-12-12 DIAGNOSIS — Z3480 Encounter for supervision of other normal pregnancy, unspecified trimester: Secondary | ICD-10-CM

## 2016-12-12 MED ORDER — COMFORT FIT MATERNITY SUPP LG MISC: 1.0000 [IU] | Freq: Every day | 0 refills | Status: DC

## 2016-12-12 NOTE — Progress Notes (Signed)
   PRENATAL VISIT NOTE  Subjective:  [name] is a 26 y.o. [phone] at 2764w1d being seen today for ongoing prenatal care.  She is currently monitored for the following issues for this low-risk pregnancy and has Supervision of other normal pregnancy, antepartum and Low vitamin D level on their problem list.  Patient reports no bleeding, no cramping, no leaking, occasional contractions and is taking procardia, denies contractions since Monday.  Reports round ligament type pain, denies LOF or bleeding.  Contractions: Irregular. Vag. Bleeding: None.  Movement: Present. Denies leaking of fluid.   The following portions of the patient's history were reviewed and updated as appropriate: allergies, current medications, past family history, past medical history, past social history, past surgical history and problem list. Problem list updated.  Objective:   Vitals:   12/12/16 0845  BP: 107/71  Pulse: 85  Weight: 197 lb (89.4 kg)    Fetal Status:     Movement: Present     General:  Alert, oriented and cooperative. Patient is in no acute distress.  Skin: Skin is warm and dry. No rash noted.   Cardiovascular: Normal heart rate noted  Respiratory: Normal respiratory effort, no problems with respiration noted  Abdomen: Soft, gravid, appropriate for gestational age.  Pain/Pressure: Present     Pelvic: Cervical exam deferred        Extremities: Normal range of motion.  Edema: None  Mental Status:  Normal mood and affect. Normal behavior. Normal judgment and thought content.   Assessment and Plan:  Pregnancy: [id number] at 6664w1d  1. Supervision of other normal pregnancy, antepartum      Stopped baby scripts d/t contractions.  Encouraged rest.  Is working and going to school.  States will stop working soon.    2. Low vitamin D level      Taking weekly vitamin D  3. Pelvic pressure in pregnancy, antepartum, third trimester     - Elastic Bandages & Supports (COMFORT FIT MATERNITY SUPP LG) MISC; 1  Units daily by Does not apply route.  Dispense: 1 each; Refill: 0  4. Pain of round ligament affecting pregnancy, antepartum     - Elastic Bandages & Supports (COMFORT FIT MATERNITY SUPP LG) MISC; 1 Units daily by Does not apply route.  Dispense: 1 each; Refill: 0  Preterm labor symptoms and general obstetric precautions including but not limited to vaginal bleeding, contractions, leaking of fluid and fetal movement were reviewed in detail with the patient. Please refer to After Visit Summary for other counseling recommendations.  Return in about 2 weeks (around 12/26/2016) for ROB.   Roe Coombsachelle A Coen Miyasato, CNM

## 2016-12-12 NOTE — Progress Notes (Signed)
Pt c/o contractions this past Monday, took procardia and it stopped them.

## 2016-12-19 ENCOUNTER — Encounter: Payer: Self-pay | Admitting: *Deleted

## 2016-12-24 ENCOUNTER — Ambulatory Visit (INDEPENDENT_AMBULATORY_CARE_PROVIDER_SITE_OTHER): Payer: Medicaid Other | Admitting: Certified Nurse Midwife

## 2016-12-24 ENCOUNTER — Other Ambulatory Visit: Payer: Self-pay

## 2016-12-24 VITALS — BP 102/64 | HR 84 | Wt 199.5 lb

## 2016-12-24 DIAGNOSIS — Z3480 Encounter for supervision of other normal pregnancy, unspecified trimester: Secondary | ICD-10-CM | POA: Diagnosis not present

## 2016-12-24 DIAGNOSIS — R7989 Other specified abnormal findings of blood chemistry: Secondary | ICD-10-CM

## 2016-12-24 NOTE — Progress Notes (Signed)
Complains of pelvic pain, discharge, no odor, itching, no burning.

## 2016-12-24 NOTE — Progress Notes (Signed)
   PRENATAL VISIT NOTE  Subjective:  [name] is a 26 y.o. [phone] at 4265w6d being seen today for ongoing prenatal care.  She is currently monitored for the following issues for this low-risk pregnancy and has Supervision of other normal pregnancy, antepartum and Low vitamin D level on their problem list.  Patient reports backache, no bleeding, no contractions, no cramping and no leaking.  Contractions: Not present. Vag. Bleeding: None.  Movement: Present. Denies leaking of fluid.   The following portions of the patient's history were reviewed and updated as appropriate: allergies, current medications, past family history, past medical history, past social history, past surgical history and problem list. Problem list updated.  Objective:   Vitals:   12/24/16 1520  BP: 102/64  Pulse: 84  Weight: 199 lb 8 oz (90.5 kg)    Fetal Status: Fetal Heart Rate (bpm): 135; doppler Fundal Height: 35 cm Movement: Present     General:  Alert, oriented and cooperative. Patient is in no acute distress.  Skin: Skin is warm and dry. No rash noted.   Cardiovascular: Normal heart rate noted  Respiratory: Normal respiratory effort, no problems with respiration noted  Abdomen: Soft, gravid, appropriate for gestational age.  Pain/Pressure: Present     Pelvic: Cervical exam deferred        Extremities: Normal range of motion.  Edema: None  Mental Status:  Normal mood and affect. Normal behavior. Normal judgment and thought content.   Assessment and Plan:  Pregnancy: [id number] at 6165w6d  1. Supervision of other normal pregnancy, antepartum     Doing well.  Pelvic pressure improved with maternity support belt.  Desires to stop working soon.    2. Low vitamin D level     Taking weekly vitamin D.  Preterm labor symptoms and general obstetric precautions including but not limited to vaginal bleeding, contractions, leaking of fluid and fetal movement were reviewed in detail with the patient. Please refer to  After Visit Summary for other counseling recommendations.  Return in about 1 week (around 12/31/2016) for ROB.   Roe Coombsachelle A Batina Dougan, CNM

## 2016-12-31 ENCOUNTER — Other Ambulatory Visit (HOSPITAL_COMMUNITY)
Admission: RE | Admit: 2016-12-31 | Discharge: 2016-12-31 | Disposition: A | Discharge status: Home / Self Care | Payer: Medicaid Other | Source: Ambulatory Visit | Attending: Certified Nurse Midwife | Admitting: Certified Nurse Midwife

## 2016-12-31 ENCOUNTER — Ambulatory Visit (INDEPENDENT_AMBULATORY_CARE_PROVIDER_SITE_OTHER): Payer: Medicaid Other | Admitting: Certified Nurse Midwife

## 2016-12-31 VITALS — BP 99/64 | HR 76 | Wt 200.8 lb

## 2016-12-31 DIAGNOSIS — Z3480 Encounter for supervision of other normal pregnancy, unspecified trimester: Secondary | ICD-10-CM | POA: Diagnosis present

## 2016-12-31 DIAGNOSIS — O26843 Uterine size-date discrepancy, third trimester: Secondary | ICD-10-CM

## 2016-12-31 DIAGNOSIS — R7989 Other specified abnormal findings of blood chemistry: Secondary | ICD-10-CM

## 2016-12-31 DIAGNOSIS — Z3A00 Weeks of gestation of pregnancy not specified: Secondary | ICD-10-CM | POA: Diagnosis not present

## 2016-12-31 DIAGNOSIS — Z8619 Personal history of other infectious and parasitic diseases: Secondary | ICD-10-CM

## 2016-12-31 MED ORDER — VALACYCLOVIR HCL 1 G PO TABS: 1000.0000 mg | ORAL_TABLET | Freq: Every day | ORAL | 1 refills | Status: DC

## 2016-12-31 NOTE — Progress Notes (Signed)
   PRENATAL VISIT NOTE  Subjective:  [name] is a 26 y.o. [phone] at 6867w6d being seen today for ongoing prenatal care.  She is currently monitored for the following issues for this low-risk pregnancy and has Supervision of other normal pregnancy, antepartum; Low vitamin D level; and History of herpes genitalis on their problem list.  Patient reports no complaints.  Contractions: Irregular. Vag. Bleeding: None.  Movement: Present. Denies leaking of fluid.   The following portions of the patient's history were reviewed and updated as appropriate: allergies, current medications, past family history, past medical history, past social history, past surgical history and problem list. Problem list updated.  Objective:   Vitals:   12/31/16 1532  BP: 99/64  Pulse: 76  Weight: 200 lb 12.8 oz (91.1 kg)    Fetal Status: Fetal Heart Rate (bpm): 130; doppler Fundal Height: 34 cm Movement: Present     General:  Alert, oriented and cooperative. Patient is in no acute distress.  Skin: Skin is warm and dry. No rash noted.   Cardiovascular: Normal heart rate noted  Respiratory: Normal respiratory effort, no problems with respiration noted  Abdomen: Soft, gravid, appropriate for gestational age.  Pain/Pressure: Present     Pelvic: Cervical exam deferred        Extremities: Normal range of motion.  Edema: None  Mental Status:  Normal mood and affect. Normal behavior. Normal judgment and thought content.   Assessment and Plan:  Pregnancy: [id number] at 7267w6d  1. Supervision of other normal pregnancy, antepartum      Doing well.  18 lb weight gain this pregnancy.  - Strep Gp B NAA - Cervicovaginal ancillary only - US MFM OB FOLLOW UP; Future  2. Low vitamin D level     Taking weekly vitamin D  3. Uterine size-date discrepancy in third trimester     S<D - US MFM OB FOLLOW UP; Future  4. History of herpes genitalis    - valACYclovir (VALTREX) 1000 MG tablet; Take 1 tablet (1,000 mg total) by  mouth daily.  Dispense: 30 tablet; Refill: 1  Preterm labor symptoms and general obstetric precautions including but not limited to vaginal bleeding, contractions, leaking of fluid and fetal movement were reviewed in detail with the patient. Please refer to After Visit Summary for other counseling recommendations.  Return in about 1 week (around 01/07/2017) for ROB.   Roe Coombsachelle A Ranie Chinchilla, CNM

## 2016-12-31 NOTE — Progress Notes (Signed)
Pt is requesting rx for Valtrex prophylaxis since she is almost 36 weeks.

## 2017-01-02 LAB — CERVICOVAGINAL ANCILLARY ONLY
BACTERIAL VAGINITIS: NEGATIVE
Candida vaginitis: NEGATIVE
Chlamydia: NEGATIVE
NEISSERIA GONORRHEA: NEGATIVE
Trichomonas: NEGATIVE

## 2017-01-02 LAB — STREP GP B NAA: STREP GROUP B AG: POSITIVE — AB

## 2017-01-04 ENCOUNTER — Other Ambulatory Visit: Payer: Self-pay | Admitting: Certified Nurse Midwife

## 2017-01-04 DIAGNOSIS — Z3480 Encounter for supervision of other normal pregnancy, unspecified trimester: Secondary | ICD-10-CM

## 2017-01-04 DIAGNOSIS — B951 Streptococcus, group B, as the cause of diseases classified elsewhere: Secondary | ICD-10-CM | POA: Insufficient documentation

## 2017-01-06 ENCOUNTER — Telehealth: Payer: Self-pay

## 2017-01-06 NOTE — Telephone Encounter (Signed)
Pt called stating that she took a procardia instead of taking an iron pill which was her intentions. She states that she now has a headache. I told her to take a tylenol for the headache, and that I would send message to provider as an [place]

## 2017-01-07 ENCOUNTER — Ambulatory Visit (HOSPITAL_COMMUNITY)
Admission: RE | Admit: 2017-01-07 | Discharge: 2017-01-07 | Disposition: A | Discharge status: Home / Self Care | Payer: Medicaid Other | Source: Ambulatory Visit | Attending: Certified Nurse Midwife | Admitting: Certified Nurse Midwife

## 2017-01-07 DIAGNOSIS — Z3A36 36 weeks gestation of pregnancy: Secondary | ICD-10-CM | POA: Diagnosis not present

## 2017-01-07 DIAGNOSIS — Z3480 Encounter for supervision of other normal pregnancy, unspecified trimester: Secondary | ICD-10-CM

## 2017-01-07 DIAGNOSIS — O26843 Uterine size-date discrepancy, third trimester: Secondary | ICD-10-CM

## 2017-01-07 DIAGNOSIS — O365930 Maternal care for other known or suspected poor fetal growth, third trimester, not applicable or unspecified: Secondary | ICD-10-CM | POA: Diagnosis not present

## 2017-01-08 ENCOUNTER — Encounter: Payer: Self-pay | Admitting: Certified Nurse Midwife

## 2017-01-08 ENCOUNTER — Ambulatory Visit (INDEPENDENT_AMBULATORY_CARE_PROVIDER_SITE_OTHER): Payer: Medicaid Other | Admitting: Certified Nurse Midwife

## 2017-01-08 VITALS — BP 117/73 | HR 94 | Wt 204.0 lb

## 2017-01-08 DIAGNOSIS — R7989 Other specified abnormal findings of blood chemistry: Secondary | ICD-10-CM

## 2017-01-08 DIAGNOSIS — B951 Streptococcus, group B, as the cause of diseases classified elsewhere: Secondary | ICD-10-CM

## 2017-01-08 DIAGNOSIS — Z3480 Encounter for supervision of other normal pregnancy, unspecified trimester: Secondary | ICD-10-CM

## 2017-01-08 NOTE — Progress Notes (Signed)
   PRENATAL VISIT NOTE  Subjective:  [name] is a 26 y.o. [phone] at 7417w0d being seen today for ongoing prenatal care.  She is currently monitored for the following issues for this low-risk pregnancy and has Supervision of other normal pregnancy, antepartum; Low vitamin D level; History of herpes genitalis; and Positive GBS test on their problem list.  Patient reports no complaints.  Contractions: Not present. Vag. Bleeding: None.  Movement: Present. Denies leaking of fluid.   The following portions of the patient's history were reviewed and updated as appropriate: allergies, current medications, past family history, past medical history, past social history, past surgical history and problem list. Problem list updated.  Objective:   Vitals:   01/08/17 1548  BP: 117/73  Pulse: 94  Weight: 204 lb (92.5 kg)    Fetal Status: Fetal Heart Rate (bpm): 138; doppler Fundal Height: 35 cm Movement: Present     General:  Alert, oriented and cooperative. Patient is in no acute distress.  Skin: Skin is warm and dry. No rash noted.   Cardiovascular: Normal heart rate noted  Respiratory: Normal respiratory effort, no problems with respiration noted  Abdomen: Soft, gravid, appropriate for gestational age.  Pain/Pressure: Present     Pelvic: Cervical exam deferred        Extremities: Normal range of motion.     Mental Status:  Normal mood and affect. Normal behavior. Normal judgment and thought content.   Assessment and Plan:  Pregnancy: [id number] at 3417w0d  1. Supervision of other normal pregnancy, antepartum     Doing well  2. Positive GBS test     PCN for labor/delivery  3. Low vitamin D level     Taking weekly vitamin D  Preterm labor symptoms and general obstetric precautions including but not limited to vaginal bleeding, contractions, leaking of fluid and fetal movement were reviewed in detail with the patient. Please refer to After Visit Summary for other counseling recommendations.    Return in about 1 week (around 01/15/2017) for ROB.   Roe Coombsachelle A Denney, CNM

## 2017-01-09 ENCOUNTER — Inpatient Hospital Stay (HOSPITAL_COMMUNITY)
Admission: AD | Admit: 2017-01-09 | Discharge: 2017-01-09 | Disposition: A | Discharge status: Home / Self Care | Payer: Medicaid Other | Source: Ambulatory Visit | Attending: Family Medicine | Admitting: Family Medicine

## 2017-01-09 ENCOUNTER — Encounter (HOSPITAL_COMMUNITY): Payer: Self-pay | Admitting: *Deleted

## 2017-01-09 DIAGNOSIS — N898 Other specified noninflammatory disorders of vagina: Secondary | ICD-10-CM | POA: Diagnosis not present

## 2017-01-09 DIAGNOSIS — Z3689 Encounter for other specified antenatal screening: Secondary | ICD-10-CM

## 2017-01-09 DIAGNOSIS — O26893 Other specified pregnancy related conditions, third trimester: Secondary | ICD-10-CM | POA: Diagnosis not present

## 2017-01-09 DIAGNOSIS — Z3A37 37 weeks gestation of pregnancy: Secondary | ICD-10-CM | POA: Diagnosis not present

## 2017-01-09 LAB — WET PREP, GENITAL
CLUE CELLS WET PREP: NONE SEEN
SPERM: NONE SEEN
TRICH WET PREP: NONE SEEN
YEAST WET PREP: NONE SEEN

## 2017-01-09 LAB — POCT FERN TEST: POCT Fern Test: NEGATIVE

## 2017-01-09 NOTE — MAU Note (Signed)
Urine in lab 

## 2017-01-09 NOTE — MAU Provider Note (Signed)
History   [id number]   Chief Complaint  Patient [id number] with  . Rupture of Membranes    HPI [name] is a 26 y.o. female  [phone] here with report of LOF yesterday. Unsure of color, was wearing drk underwear. Happened after voiding.  Leaking of fluid has not continued. Pt reports some contractions yesterday, none yesterday. She denies vaginal bleeding. Last intercourse was not recent. She reports good fetal movement. All other systems negative.    Patient's last menstrual period was 04/22/2016.  OB History  Gravida Para Term Preterm AB Living  6 3 3  0 2 3  SAB TAB Ectopic Multiple Live Births  0 1 1 0 3    # Outcome Date GA Lbr Len/2nd Weight Sex Delivery Anes PTL Lv  6 Current           5 Term 02/15/15 668w3d   F Vag-Spont   LIV  4 Ectopic 02/08/14          3 Term 03/03/12 2531w6d 01:10 / 00:07 7 lb 2 oz (3.232 kg) F Vag-Spont Local  LIV     Birth Comments: wnl  2 Term 04/27/08 1284w3d   M Vag-Spont   LIV  1 TAB 04/05/06 6266w0d             Past Medical History:  Diagnosis Date  . Anemia     Family History  Problem Relation Age of Onset  . Diabetes Mother   . Hyperlipidemia Mother   . Hypertension Mother     Social History   Socioeconomic History  . Marital status: Divorced    Spouse name: None  . Number of children: None  . Years of education: None  . Highest education level: None  Social Needs  . Financial resource strain: None  . Food insecurity - worry: None  . Food insecurity - inability: None  . Transportation needs - medical: None  . Transportation needs - non-medical: None  Occupational History  . None  Tobacco Use  . Smoking status: Never Smoker  . Smokeless tobacco: Never Used  Substance and Sexual Activity  . Alcohol use: No  . Drug use: No  . Sexual activity: Yes  Other Topics Concern  . None  Social History Narrative  . None    Allergies  Allergen Reactions  . Pollen Extract Other (See Comments)    Migraines    No current  facility-administered medications on file prior to encounter.    Current Outpatient Medications on File Prior to Encounter  Medication Sig Dispense Refill  . Prenat-FeAsp-Meth-FA-DHA w/o A (PRENATE PIXIE) 10-0.6-0.4-200 MG CAPS Take 1 tablet by mouth daily. 30 capsule 12  . valACYclovir (VALTREX) 1000 MG tablet Take 1 tablet (1,000 mg total) by mouth daily. 30 tablet 1  . Vitamin D, Ergocalciferol, (DRISDOL) 50000 units CAPS capsule Take 1 capsule (50,000 Units total) by mouth every 7 (seven) days. 30 capsule 2  . Elastic Bandages & Supports (COMFORT FIT MATERNITY SUPP LG) MISC 1 Units daily by Does not apply route. 1 each 0  . NIFEdipine (PROCARDIA XL) 30 MG 24 hr tablet Take 1 tablet (30 mg total) daily by mouth. (Patient not taking: Reported on 12/24/2016) 30 tablet 0     Review of Systems  Gastrointestinal: Negative for abdominal pain.  Genitourinary: Positive for vaginal discharge. Negative for vaginal bleeding.     Physical Exam   Vitals:   01/09/17 1040 01/09/17 1051  BP:  116/73  Pulse:  91  Resp:  18  Temp:  98.2 F (36.8 C)  TempSrc:  Oral  SpO2:  97%  Weight: 203 lb (92.1 kg)   Height: 5\' 4"  (1.626 m)     Physical Exam  Nursing note and vitals reviewed. Constitutional: She is oriented to person, place, and time. She appears well-developed and well-nourished. No distress.  HENT:  Head: Normocephalic and atraumatic.  Neck: Normal range of motion.  Cardiovascular: Normal rate.  Respiratory: Effort normal. No respiratory distress.  GI: Soft. She exhibits no distension. There is no tenderness.  gravid  Genitourinary:  Genitourinary Comments: External: no lesions or erythema Vagina: rugated, pink, moist, moderate yellow thin discharge, no pool, fern neg Cervix closed/thick   Musculoskeletal: Normal range of motion.  Neurological: She is alert and oriented to person, place, and time.  Skin: Skin is warm and dry.  Psychiatric: She has a normal mood and affect.   EFM: 140 bpm, mod variability, + accels, no decels Toco: rare  Results for orders placed or performed during the hospital encounter of 01/09/17 (from the past 24 hour(s))  Wet prep, genital     Status: Abnormal   Collection Time: 01/09/17 11:09 AM  Result Value Ref Range   Yeast Wet Prep HPF POC NONE SEEN NONE SEEN   Trich, Wet Prep NONE SEEN NONE SEEN   Clue Cells Wet Prep HPF POC NONE SEEN NONE SEEN   WBC, Wet Prep HPF POC MANY (A) NONE SEEN   Sperm NONE SEEN   POCT fern test     Status: None   Collection Time: 01/09/17 11:15 AM  Result Value Ref Range   POCT Fern Test Negative = intact amniotic membranes     MAU Course  Procedures  MDM Labs ordered and reviewed. No evidence of SROM or labor. Stable for discharge home.  Assessment and Plan   1. [redacted] weeks gestation of pregnancy   2. Vaginal discharge during pregnancy in third trimester   3. NST (non-stress test) reactive    Discharge home Follow up in OB office as scheduled next week Labor precautions  Allergies as of 01/09/2017      Reactions   Pollen Extract Other (See Comments)   Migraines      Medication List    STOP taking these medications   NIFEdipine 30 MG 24 hr tablet Commonly known as:  PROCARDIA XL     TAKE these medications   COMFORT FIT MATERNITY SUPP LG Misc 1 Units daily by Does not apply route.   PRENATE PIXIE 10-0.6-0.4-200 MG Caps Take 1 tablet by mouth daily.   valACYclovir 1000 MG tablet Commonly known as:  VALTREX Take 1 tablet (1,000 mg total) by mouth daily.   Vitamin D (Ergocalciferol) 50000 units Caps capsule Commonly known as:  DRISDOL Take 1 capsule (50,000 Units total) by mouth every 7 (seven) days.      Donette LarryBhambri, Prajna Vanderpool, CNM 01/09/2017 11:58 AM

## 2017-01-09 NOTE — Discharge Instructions (Signed)

## 2017-01-09 NOTE — MAU Note (Signed)
Pt stated she noticed leaking yesterday. A bunch of fluid came out yesterday not as much today. Reports some contractions.

## 2017-01-09 NOTE — MAU Note (Signed)
Patient reports LOF since yesterday. Is not having to wear a pad.  Denies VB.  +FM States has had no recent VE. Denies any pain at this time.

## 2017-01-10 ENCOUNTER — Other Ambulatory Visit: Payer: Self-pay | Admitting: Certified Nurse Midwife

## 2017-01-15 ENCOUNTER — Other Ambulatory Visit: Payer: Self-pay

## 2017-01-15 ENCOUNTER — Ambulatory Visit (INDEPENDENT_AMBULATORY_CARE_PROVIDER_SITE_OTHER): Payer: Medicaid Other | Admitting: Certified Nurse Midwife

## 2017-01-15 ENCOUNTER — Encounter: Payer: Self-pay | Admitting: Certified Nurse Midwife

## 2017-01-15 VITALS — BP 108/64 | HR 77 | Wt 202.6 lb

## 2017-01-15 DIAGNOSIS — Z8619 Personal history of other infectious and parasitic diseases: Secondary | ICD-10-CM

## 2017-01-15 DIAGNOSIS — Z3483 Encounter for supervision of other normal pregnancy, third trimester: Secondary | ICD-10-CM

## 2017-01-15 DIAGNOSIS — B951 Streptococcus, group B, as the cause of diseases classified elsewhere: Secondary | ICD-10-CM

## 2017-01-15 DIAGNOSIS — R7989 Other specified abnormal findings of blood chemistry: Secondary | ICD-10-CM

## 2017-01-15 DIAGNOSIS — Z3480 Encounter for supervision of other normal pregnancy, unspecified trimester: Secondary | ICD-10-CM

## 2017-01-15 NOTE — Progress Notes (Signed)
   PRENATAL VISIT NOTE  Subjective:  [name] is a 26 y.o. [phone] at 5387w0d being seen today for ongoing prenatal care.  She is currently monitored for the following issues for this low-risk pregnancy and has Supervision of other normal pregnancy, antepartum; Low vitamin D level; History of herpes genitalis; and Positive GBS test on their problem list.  Patient reports no complaints.  Contractions: Not present. Vag. Bleeding: None.  Movement: Present. Denies leaking of fluid.   The following portions of the patient's history were reviewed and updated as appropriate: allergies, current medications, past family history, past medical history, past social history, past surgical history and problem list. Problem list updated.  Objective:   Vitals:   01/15/17 0943  BP: 108/64  Pulse: 77  Weight: 202 lb 9.6 oz (91.9 kg)    Fetal Status: Fetal Heart Rate (bpm): 145; doppler Fundal Height: 37 cm Movement: Present     General:  Alert, oriented and cooperative. Patient is in no acute distress.  Skin: Skin is warm and dry. No rash noted.   Cardiovascular: Normal heart rate noted  Respiratory: Normal respiratory effort, no problems with respiration noted  Abdomen: Soft, gravid, appropriate for gestational age.  Pain/Pressure: Absent     Pelvic: Cervical exam deferred        Extremities: Normal range of motion.  Edema: None  Mental Status:  Normal mood and affect. Normal behavior. Normal judgment and thought content.   Assessment and Plan:  Pregnancy: [id number] at 7787w0d  1. Supervision of other normal pregnancy, antepartum      Doing well  2. Positive GBS test     PCN for labor/delivery  3. Low vitamin D level     Taking weekly vitamin D  4. History of herpes genitalis     Taking valtrex for suppression therapy.  Term labor symptoms and general obstetric precautions including but not limited to vaginal bleeding, contractions, leaking of fluid and fetal movement were reviewed in detail  with the patient. Please refer to After Visit Summary for other counseling recommendations.  Return in about 1 week (around 01/22/2017) for ROB, schedule IOL for 41 weeks.   Roe Coombsachelle A Calee Nugent, CNM

## 2017-01-22 ENCOUNTER — Other Ambulatory Visit: Payer: Self-pay

## 2017-01-22 ENCOUNTER — Ambulatory Visit (INDEPENDENT_AMBULATORY_CARE_PROVIDER_SITE_OTHER): Payer: Medicaid Other | Admitting: Certified Nurse Midwife

## 2017-01-22 ENCOUNTER — Telehealth (HOSPITAL_COMMUNITY): Payer: Self-pay | Admitting: *Deleted

## 2017-01-22 ENCOUNTER — Encounter (HOSPITAL_COMMUNITY): Payer: Self-pay | Admitting: *Deleted

## 2017-01-22 VITALS — BP 122/71 | HR 90 | Wt 203.7 lb

## 2017-01-22 DIAGNOSIS — B951 Streptococcus, group B, as the cause of diseases classified elsewhere: Secondary | ICD-10-CM

## 2017-01-22 DIAGNOSIS — Z3483 Encounter for supervision of other normal pregnancy, third trimester: Secondary | ICD-10-CM

## 2017-01-22 DIAGNOSIS — R7989 Other specified abnormal findings of blood chemistry: Secondary | ICD-10-CM

## 2017-01-22 DIAGNOSIS — Z3480 Encounter for supervision of other normal pregnancy, unspecified trimester: Secondary | ICD-10-CM

## 2017-01-22 NOTE — Progress Notes (Signed)
   PRENATAL VISIT NOTE  Subjective:  [name] is a 26 y.o. [phone] at 5350w0d being seen today for ongoing prenatal care.  She is currently monitored for the following issues for this low-risk pregnancy and has Supervision of other normal pregnancy, antepartum; Low vitamin D level; History of herpes genitalis; and Positive GBS test on their problem list.  Patient reports no bleeding, no leaking and occasional contractions.  Contractions: Regular. Vag. Bleeding: None.  Movement: Present. Denies leaking of fluid.   The following portions of the patient's history were reviewed and updated as appropriate: allergies, current medications, past family history, past medical history, past social history, past surgical history and problem list. Problem list updated.  Objective:   Vitals:   01/22/17 0954  BP: 122/71  Pulse: 90  Weight: 203 lb 11.2 oz (92.4 kg)    Fetal Status: Fetal Heart Rate (bpm): 152; doppler Fundal Height: 34 cm Movement: Present  Presentation: Vertex  General:  Alert, oriented and cooperative. Patient is in no acute distress.  Skin: Skin is warm and dry. No rash noted.   Cardiovascular: Normal heart rate noted  Respiratory: Normal respiratory effort, no problems with respiration noted  Abdomen: Soft, gravid, appropriate for gestational age.  Pain/Pressure: Present     Pelvic: Cervical exam performed Dilation: 1 Effacement (%): 50 Station: -3  Extremities: Normal range of motion.  Edema: None  Mental Status:  Normal mood and affect. Normal behavior. Normal judgment and thought content.   Assessment and Plan:  Pregnancy: [id number] at 5550w0d  1. Supervision of other normal pregnancy, antepartum       2. Positive GBS test     PCN for labor/delivery  3. Low vitamin D level     Taking weekly vitamin D  Term labor symptoms and general obstetric precautions including but not limited to vaginal bleeding, contractions, leaking of fluid and fetal movement were reviewed in  detail with the patient. Please refer to After Visit Summary for other counseling recommendations.  Return in about 1 week (around 01/29/2017) for ROB, NST.   Roe Coombsachelle A Diamon Reddinger, CNM

## 2017-01-22 NOTE — Telephone Encounter (Signed)
Preadmission screen  

## 2017-01-22 NOTE — Progress Notes (Signed)
Having contractions 10-15 mins apart since AM.

## 2017-01-25 ENCOUNTER — Other Ambulatory Visit: Payer: Self-pay

## 2017-01-25 ENCOUNTER — Encounter (HOSPITAL_COMMUNITY): Payer: Self-pay | Admitting: *Deleted

## 2017-01-25 ENCOUNTER — Inpatient Hospital Stay (HOSPITAL_COMMUNITY)
Admission: AD | Admit: 2017-01-25 | Discharge: 2017-01-25 | Disposition: A | Discharge status: Home / Self Care | Payer: Medicaid Other | Source: Ambulatory Visit | Attending: Obstetrics & Gynecology | Admitting: Obstetrics & Gynecology

## 2017-01-25 ENCOUNTER — Inpatient Hospital Stay (HOSPITAL_COMMUNITY)
Admission: AD | Admit: 2017-01-25 | Discharge: 2017-01-27 | DRG: 806 | Disposition: A | Discharge status: Home / Self Care | Payer: Medicaid Other | Source: Ambulatory Visit | Attending: Obstetrics and Gynecology | Admitting: Obstetrics and Gynecology

## 2017-01-25 DIAGNOSIS — O99824 Streptococcus B carrier state complicating childbirth: Secondary | ICD-10-CM | POA: Diagnosis not present

## 2017-01-25 DIAGNOSIS — O479 False labor, unspecified: Secondary | ICD-10-CM

## 2017-01-25 DIAGNOSIS — A6000 Herpesviral infection of urogenital system, unspecified: Secondary | ICD-10-CM | POA: Diagnosis present

## 2017-01-25 DIAGNOSIS — O9832 Other infections with a predominantly sexual mode of transmission complicating childbirth: Secondary | ICD-10-CM | POA: Diagnosis present

## 2017-01-25 DIAGNOSIS — Z3A39 39 weeks gestation of pregnancy: Secondary | ICD-10-CM | POA: Diagnosis not present

## 2017-01-25 DIAGNOSIS — Z3483 Encounter for supervision of other normal pregnancy, third trimester: Secondary | ICD-10-CM | POA: Diagnosis present

## 2017-01-25 LAB — CBC
HCT: 36.5 % (ref 36.0–46.0)
Hemoglobin: 12.5 g/dL (ref 12.0–15.0)
MCH: 27.2 pg (ref 26.0–34.0)
MCHC: 34.2 g/dL (ref 30.0–36.0)
MCV: 79.3 fL (ref 78.0–100.0)
PLATELETS: 330 10*3/uL (ref 150–400)
RBC: 4.6 MIL/uL (ref 3.87–5.11)
RDW: 14.2 % (ref 11.5–15.5)
WBC: 13.3 10*3/uL — ABNORMAL HIGH (ref 4.0–10.5)

## 2017-01-25 LAB — TYPE AND SCREEN
ABO/RH(D): B POS
Antibody Screen: NEGATIVE

## 2017-01-25 LAB — ABO/RH: ABO/RH(D): B POS

## 2017-01-25 MED ORDER — OXYCODONE-ACETAMINOPHEN 5-325 MG PO TABS
1.0000 | ORAL_TABLET | ORAL | Status: DC | PRN
  Administered 2017-01-25 – 2017-01-26 (×2): 1 via ORAL
  Filled 2017-01-25: qty 1

## 2017-01-25 MED ORDER — OXYTOCIN 40 UNITS IN LACTATED RINGERS INFUSION - SIMPLE MED
2.5000 [IU]/h | INTRAVENOUS | Status: DC
  Administered 2017-01-25: 2.5 [IU]/h via INTRAVENOUS
  Filled 2017-01-25: qty 1000

## 2017-01-25 MED ORDER — FENTANYL CITRATE (PF) 100 MCG/2ML IJ SOLN
INTRAMUSCULAR | Status: AC
  Filled 2017-01-25: qty 2

## 2017-01-25 MED ORDER — MISOPROSTOL 200 MCG PO TABS
400.0000 ug | ORAL_TABLET | Freq: Once | ORAL | Status: AC | PRN
  Administered 2017-01-25: 400 ug via BUCCAL
  Filled 2017-01-25: qty 2

## 2017-01-25 MED ORDER — SIMETHICONE 80 MG PO CHEW: 80.0000 mg | CHEWABLE_TABLET | ORAL | Status: DC | PRN

## 2017-01-25 MED ORDER — FLEET ENEMA 7-19 GM/118ML RE ENEM: 1.0000 | ENEMA | RECTAL | Status: DC | PRN

## 2017-01-25 MED ORDER — WITCH HAZEL-GLYCERIN EX PADS: 1.0000 "application " | MEDICATED_PAD | CUTANEOUS | Status: DC | PRN

## 2017-01-25 MED ORDER — DIPHENHYDRAMINE HCL 25 MG PO CAPS: 25.0000 mg | ORAL_CAPSULE | Freq: Four times a day (QID) | ORAL | Status: DC | PRN

## 2017-01-25 MED ORDER — TETANUS-DIPHTH-ACELL PERTUSSIS 5-2.5-18.5 LF-MCG/0.5 IM SUSP
0.5000 mL | Freq: Once | INTRAMUSCULAR | Status: AC
  Administered 2017-01-26: 0.5 mL via INTRAMUSCULAR
  Filled 2017-01-25: qty 0.5

## 2017-01-25 MED ORDER — ACETAMINOPHEN 325 MG PO TABS
650.0000 mg | ORAL_TABLET | ORAL | Status: DC | PRN
  Administered 2017-01-26: 650 mg via ORAL
  Filled 2017-01-25: qty 2

## 2017-01-25 MED ORDER — SENNOSIDES-DOCUSATE SODIUM 8.6-50 MG PO TABS
2.0000 | ORAL_TABLET | ORAL | Status: DC
  Administered 2017-01-25 – 2017-01-26 (×2): 2 via ORAL
  Filled 2017-01-25 (×2): qty 2

## 2017-01-25 MED ORDER — DIBUCAINE 1 % RE OINT: 1.0000 "application " | TOPICAL_OINTMENT | RECTAL | Status: DC | PRN

## 2017-01-25 MED ORDER — LIDOCAINE HCL (PF) 1 % IJ SOLN
30.0000 mL | INTRAMUSCULAR | Status: DC | PRN
  Filled 2017-01-25: qty 30

## 2017-01-25 MED ORDER — FENTANYL CITRATE (PF) 100 MCG/2ML IJ SOLN: 100.0000 ug | INTRAMUSCULAR | Status: DC | PRN

## 2017-01-25 MED ORDER — PRENATAL MULTIVITAMIN CH
1.0000 | ORAL_TABLET | Freq: Every day | ORAL | Status: DC
  Administered 2017-01-25 – 2017-01-27 (×3): 1 via ORAL
  Filled 2017-01-25 (×3): qty 1

## 2017-01-25 MED ORDER — LACTATED RINGERS IV SOLN: 500.0000 mL | INTRAVENOUS | Status: DC | PRN

## 2017-01-25 MED ORDER — ONDANSETRON HCL 4 MG/2ML IJ SOLN: 4.0000 mg | INTRAMUSCULAR | Status: DC | PRN

## 2017-01-25 MED ORDER — LACTATED RINGERS IV SOLN
INTRAVENOUS | Status: DC
  Administered 2017-01-25: 11:00:00 via INTRAVENOUS

## 2017-01-25 MED ORDER — OXYCODONE-ACETAMINOPHEN 5-325 MG PO TABS
2.0000 | ORAL_TABLET | ORAL | Status: DC | PRN
  Filled 2017-01-25: qty 2

## 2017-01-25 MED ORDER — SOD CITRATE-CITRIC ACID 500-334 MG/5ML PO SOLN: 30.0000 mL | ORAL | Status: DC | PRN

## 2017-01-25 MED ORDER — BENZOCAINE-MENTHOL 20-0.5 % EX AERO: 1.0000 "application " | INHALATION_SPRAY | CUTANEOUS | Status: DC | PRN

## 2017-01-25 MED ORDER — COCONUT OIL OIL: 1.0000 "application " | TOPICAL_OIL | Status: DC | PRN

## 2017-01-25 MED ORDER — ZOLPIDEM TARTRATE 5 MG PO TABS
5.0000 mg | ORAL_TABLET | Freq: Every evening | ORAL | Status: DC | PRN
Start: 2017-01-25 — End: 2017-01-27

## 2017-01-25 MED ORDER — IBUPROFEN 600 MG PO TABS
600.0000 mg | ORAL_TABLET | Freq: Four times a day (QID) | ORAL | Status: DC
  Administered 2017-01-25 – 2017-01-27 (×9): 600 mg via ORAL
  Filled 2017-01-25 (×9): qty 1

## 2017-01-25 MED ORDER — ONDANSETRON HCL 4 MG/2ML IJ SOLN: 4.0000 mg | Freq: Four times a day (QID) | INTRAMUSCULAR | Status: DC | PRN

## 2017-01-25 MED ORDER — OXYTOCIN BOLUS FROM INFUSION
500.0000 mL | Freq: Once | INTRAVENOUS | Status: AC
  Administered 2017-01-25: 500 mL via INTRAVENOUS

## 2017-01-25 MED ORDER — SODIUM CHLORIDE 0.9 % IV SOLN
2.0000 g | Freq: Once | INTRAVENOUS | Status: DC
  Filled 2017-01-25: qty 2000

## 2017-01-25 MED ORDER — ACETAMINOPHEN 325 MG PO TABS
650.0000 mg | ORAL_TABLET | ORAL | Status: DC | PRN
Start: 2017-01-25 — End: 2017-01-27
  Administered 2017-01-25 – 2017-01-27 (×2): 650 mg via ORAL
  Filled 2017-01-25 (×2): qty 2

## 2017-01-25 MED ORDER — ONDANSETRON HCL 4 MG PO TABS
4.0000 mg | ORAL_TABLET | ORAL | Status: DC | PRN
Start: 2017-01-25 — End: 2017-01-27

## 2017-01-25 NOTE — MAU Note (Signed)
Contractions since Thurs night. Stronger tonight. Some bloody show. 1cm and 50% this past WEds

## 2017-01-25 NOTE — Progress Notes (Signed)
At routine 4hr postpartum check, pt fundus was firm but felt "full".  With fundal massage, many clots were expressed.  All pads were weighed and EBL total was 460ml.  Pt vitals stable, pt asymptomatic, pt had returned from voiding about 10 minutes prior to assessment. MD on call, Dr. Parke SimmersBland, notified and he came to room to assess patient. Pt reported pain 8/10 from the fundal massage so scheduled motrin and prn Percocet given.  Buccal cytotec 400mcg given.  No new orders.  Pt instructed to only go to the bathroom with assistance, and to save her pads whenever she changes them.  Pt verbalized understanding.  Dr. Parke SimmersBland reported he would have CBC drawn tomorrow morning, 12/23 @ 0500.

## 2017-01-25 NOTE — Lactation Note (Signed)
This note was copied from a baby's chart. Lactation Consultation Note  Patient Name: [name] ZOXWR'UToday's Date: 01/25/2017 Reason for consult: Initial assessment;Term P3 Experienced breastfeeding first baby.  Newborn is 5 hours old.  Mom reports baby is latching easily.  Instructed to feed with cues and call for assist/concerns.  Breastfeeding consultation services and support information given to patient.  Maternal Data    Feeding Feeding Type: Breast Fed Length of feed: 30 min  LATCH Score Latch: Grasps breast easily, tongue down, lips flanged, rhythmical sucking.  Audible Swallowing: A few with stimulation  Type of Nipple: Everted at rest and after stimulation  Comfort (Breast/Nipple): Soft / non-tender  Hold (Positioning): No assistance needed to correctly position infant at breast.  LATCH Score: 9  Interventions    Lactation Tools Discussed/Used     Consult Status Consult Status: Follow-up Date: 01/26/17 Follow-up type: In-patient    Huston FoleyMOULDEN, Myleka Moncure S 01/25/2017, 5:03 PM

## 2017-01-25 NOTE — H&P (Signed)
LABOR AND DELIVERY ADMISSION HISTORY AND PHYSICAL NOTE  [name] is a 26 y.o. female [id number] with IUP at 3216w3d by US presenting for SOL.  She reports positive fetal movement. She denies leakage of fluid or vaginal bleeding.  Presented to MAU @7dilation  with bulging bag, immediate admit and transported to L/D where immediate recheck showed her to be complete +2 and pushing.  There was no time for IV fentanyl/ampicillin and baby was born almost immediately  Prenatal History/Complications: PNC at [place] Pregnancy complications:  - gbs positive Hx of HSV  Clinic [facility] Prenatal Labs  Dating U/S Blood type: B/Positive/-- (06/04 1531)   Genetic Screen AFP:   NEG      NIPS:Mat21Normal Antibody:Negative (06/04 1531)  Anatomic US Normal @18wks ; female fetus Rubella: 6.09 (06/04 1531)  GTT Third trimester: WNL RPR: Non Reactive (10/11 1015)   Flu vaccine   Declined 12/24/16 HBsAg: Negative (06/04 1531)   TDaP vaccine                                               Rhogam:n/a B+ HIV:   NR  Baby Food Breast/Bottle                                        AVW:UJWJXBJYGBS:Positive (11/27 1604)  Contraception    pill Pap:07/08/16: neg  Circumcision   n/a   Pediatrician   Dr Chanetta Marshallimberlake   Support Person FOB   Prenatal Classes      Past Medical History: Past Medical History:  Diagnosis Date  . Anemia     Past Surgical History: Past Surgical History:  Procedure Laterality Date  . NO PAST SURGERIES      Obstetrical History: OB History    Gravida Para Term Preterm AB Living   6 3 3  0 2 3   SAB TAB Ectopic Multiple Live Births   0 1 1 0 3      Social History: Social History   Socioeconomic History  . Marital status: Divorced    Spouse name: None  . Number of children: None  . Years of education: None  . Highest education level: None  Social Needs  . Financial resource strain: None  . Food insecurity - worry: None  . Food insecurity - inability: None  . Transportation needs - medical: None  .  Transportation needs - non-medical: None  Occupational History  . None  Tobacco Use  . Smoking status: Never Smoker  . Smokeless tobacco: Never Used  Substance and Sexual Activity  . Alcohol use: No  . Drug use: No  . Sexual activity: Yes  Other Topics Concern  . None  Social History Narrative  . None    Family History: Family History  Problem Relation Age of Onset  . Diabetes Mother   . Hyperlipidemia Mother   . Hypertension Mother     Allergies: Allergies  Allergen Reactions  . Pollen Extract Other (See Comments)    Migraines    Medications Prior to Admission  Medication Sig Dispense Refill Last Dose  . acetaminophen (TYLENOL) 500 MG tablet Take 1,000 mg by mouth every 6 (six) hours as needed for mild pain.   01/25/2017 at Unknown time  . Prenat-FeAsp-Meth-FA-DHA w/o A (PRENATE PIXIE) 10-0.6-0.4-200 MG CAPS Take 1  tablet by mouth daily. 30 capsule 12 01/24/2017 at Unknown time  . valACYclovir (VALTREX) 1000 MG tablet Take 1 tablet (1,000 mg total) by mouth daily. 30 tablet 1 Past Week at Unknown time  . Vitamin D, Ergocalciferol, (DRISDOL) 50000 units CAPS capsule Take 1 capsule (50,000 Units total) by mouth every 7 (seven) days. 30 capsule 2 Past Month at Unknown time  . Elastic Bandages & Supports (COMFORT FIT MATERNITY SUPP LG) MISC 1 Units daily by Does not apply route. 1 each 0 Taking     Review of Systems  All systems reviewed and negative except as stated in HPI  Physical Exam Blood pressure 109/73, pulse 86, temperature 98 F (36.7 C), temperature source Oral, resp. rate 16, height 5\' 4"  (1.626 m), weight 92.1 kg (203 lb), last menstrual period 04/22/2016, SpO2 97 %, unknown if currently breastfeeding. General appearance: alert, cooperative, appears stated age and no distress Lungs: no respiratory distress Heart: regular rate Abdomen: soft, non-tender; bowel sounds normal Extremities: No calf swelling or tenderness Presentation: cephalic Fetal  monitoring: Baby was born within 2 minutes of entering room Uterine activity: Baby was born within 2 minutes of entering room Dilation: 10 Effacement (%): 80 Station: +2 Exam by:: Hubert AzureM. Wilkins RNC   Prenatal labs: ABO, Rh: B/Positive/-- (06/04 1531) Antibody: Negative (06/04 1531) Rubella: 6.09 (06/04 1531) RPR: Non Reactive (10/11 1015)  HBsAg: Negative (06/04 1531)  HIV:   NR GC/Chlamydia: neg GBS: Positive (11/27 1604)  1 hr Glucola: normal Genetic screening:  normal Anatomy US: normal  Prenatal Transfer Tool  Maternal Diabetes: No Genetic Screening: Normal Maternal Ultrasounds/Referrals: Normal Fetal Ultrasounds or other Referrals:  None Maternal Substance Abuse:  No Significant Maternal Medications:  None Significant Maternal Lab Results: GBS pos  Results for orders placed or performed during the hospital encounter of 01/25/17 (from the past 24 hour(s))  CBC   Collection Time: 01/25/17 11:19 AM  Result Value Ref Range   WBC 13.3 (H) 4.0 - 10.5 K/uL   RBC 4.60 3.87 - 5.11 MIL/uL   Hemoglobin 12.5 12.0 - 15.0 g/dL   HCT 16.136.5 09.636.0 - 04.546.0 %   MCV 79.3 78.0 - 100.0 fL   MCH 27.2 26.0 - 34.0 pg   MCHC 34.2 30.0 - 36.0 g/dL   RDW 40.914.2 81.111.5 - 91.415.5 %   Platelets 330 150 - 400 K/uL    Patient Active Problem List   Diagnosis Date Noted  . Indication for care in labor or delivery 01/25/2017  . Positive GBS test 01/04/2017  . History of herpes genitalis 12/31/2016  . Low vitamin D level 07/10/2016  . Supervision of other normal pregnancy, antepartum 07/08/2016    Assessment: [name] is a 26 y.o. [id number] at 4360w3d here for SOL   #Labor: expectant #Pain: Requested IV pain meds but was complete and pushing, baby born within 2 minutes of entering room #FWB: cat1 #ID:  GBS pos (no time for meds) #MOF: breast #MOC:pill #Circ:  Female baby  Marthenia RollingScott Bland 01/25/2017, 11:59 AM  I confirm that I have verified the information documented in the resident's note and that I  have also personally reperformed the physical exam and all medical decision making activities.  The patient was seen and examined by me also Agree with note NST reactive and reassuring UCs as listed Cervical exams as listed in note  Aviva SignsWilliams, Latalia Etzler L, CNM

## 2017-01-25 NOTE — Progress Notes (Signed)
G6P4 @ 39.[redacted] wksga. Presents to triage for ctx that has been on going since this morning. + FM   EFM applied by previous nurse.   1110: SVE: 7-8/BB  1112: provider notified. Report status of pt given. Orders receive to admit. Aware pt is GBS+  1113: birthing charge nurse notified by RN Noreene FilbertSchmidt. Room assigned to 166. Pt to birthing via bed

## 2017-01-25 NOTE — MAU Note (Signed)
Pt presents with c/o ctxs that began 0300.  Reports ctxs are every 5-8 minutes apart.  Denies LOF or VB.  Reports +FM. Reports seen overnight in MAU, states cervix was 2.5cms.

## 2017-01-25 NOTE — MAU Note (Signed)
I have communicated with Dr. Rachelle HoraMoss and reviewed vital signs:  Vitals:   01/25/17 0151 01/25/17 0305  BP: 132/70 127/66  Pulse: 87 78  Resp: 18 18  Temp: 98 F (36.7 C) 98 F (36.7 C)  SpO2: 98%     Vaginal exam:  Dilation: 2.5 Effacement (%): 50 Cervical Position: Posterior Station: -3 Presentation: Vertex Exam by:: Quintella BatonJo Barham rNC,   Also reviewed contraction pattern and that non-stress test is reactive.  It has been documented that patient is contracting every 4-7  minutes with minimal cervical change over since last exam at office on Wed. not indicating active labor.  Patient denies any other complaints.  Based on this report provider has given order for discharge.  A discharge order and diagnosis entered by a provider.   Labor discharge instructions reviewed with patient.

## 2017-01-26 LAB — CBC
HEMATOCRIT: 28.6 % — AB (ref 36.0–46.0)
Hemoglobin: 9.8 g/dL — ABNORMAL LOW (ref 12.0–15.0)
MCH: 27.1 pg (ref 26.0–34.0)
MCHC: 34.3 g/dL (ref 30.0–36.0)
MCV: 79.2 fL (ref 78.0–100.0)
Platelets: 265 10*3/uL (ref 150–400)
RBC: 3.61 MIL/uL — ABNORMAL LOW (ref 3.87–5.11)
RDW: 14.3 % (ref 11.5–15.5)
WBC: 14.7 10*3/uL — ABNORMAL HIGH (ref 4.0–10.5)

## 2017-01-26 LAB — RPR: RPR: NONREACTIVE

## 2017-01-26 NOTE — Lactation Note (Signed)
This note was copied from a baby's chart. Lactation Consultation Note  Patient Name: [name] ZOXWR'UToday's Date: 01/26/2017 Reason for consult: Follow-up assessment;Term;Infant < 6lbs  Baby is 30 hours old  LC reviewed and updated the doc flow sheets per mom.  Baby woke up while LC present, LC placed baby STS and mom latched Using the cradle position with depth.  LC checked lip lines and flipped upper lip to flanged position and increased swallows  Noted. LC showed mom how to do the breast compressions and noted the increased  Swallows. Mom comfortable.  Mom denies soreness. Sore nipple and engorgement prevention and tx reviewed.  Mom already has a hand pump provided by the RN caring for mom.     Maternal Data Has patient been taught Hand Expression?: Yes Does the patient have breastfeeding experience prior to this delivery?: Yes  Feeding Feeding Type: Breast Fed Length of feed: (several swallows , increased with compressions / stil.l feeding at 7 mins )  LATCH Score Latch: Grasps breast easily, tongue down, lips flanged, rhythmical sucking.  Audible Swallowing: Spontaneous and intermittent  Type of Nipple: Everted at rest and after stimulation  Comfort (Breast/Nipple): Filling, red/small blisters or bruises, mild/mod discomfort  Hold (Positioning): Assistance needed to correctly position infant at breast and maintain latch.  LATCH Score: 8  Interventions Interventions: Breast feeding basics reviewed;Assisted with latch;Skin to skin;Breast massage;Hand express;Breast compression  Lactation Tools Discussed/Used Tools: Pump Breast pump type: Manual WIC Program: Yes Pump Review: Milk Storage   Consult Status Consult Status: Follow-up Date: 01/27/17 Follow-up type: In-patient    [name] 01/26/2017, 5:25 PM

## 2017-01-26 NOTE — Progress Notes (Signed)
Patient ID: August Luzian Rolfe, female   DOB: April 12, 1990, 26 y.o.   MRN: [id number] INTERVAL NOTE:  S:   Sitting in bed, BFing, min cramping, (+) voids, moderate bleeding with large clots (690 ml; per RN), denies HA/NV/dizziness  O:   VSS, AAO x 3, NAD  U-even, firm  Moderate lochia with large clots in toilet and on peripad // clots with 8x4 cm sized placental tissue seen on peripad  A / P:   PPD #0  Stable post partum  Reassurance given that bleeding should improve since passing the clots and placental tissue.  Patient verbalized an understanding of the plan of care and agrees.  Routine PP orders  Raelyn Moraolitta Saysha Menta, MSN, CNM 01/26/2017, 1:01 AM

## 2017-01-26 NOTE — Progress Notes (Signed)
Post Partum Day 1 Subjective: no complaints, up ad lib, voiding and tolerating PO  Objective: Blood pressure (!) 105/47, pulse 74, temperature 98.1 F (36.7 C), temperature source Oral, resp. rate 18, height 5\' 4"  (1.626 m), weight 203 lb (92.1 kg), last menstrual period 04/22/2016, SpO2 100 %, unknown if currently breastfeeding.  Physical Exam:  General: alert, cooperative, appears stated age and no distress Lochia: appropriate Uterine Fundus: firm Incision: n/a DVT Evaluation: No evidence of DVT seen on physical exam.  Recent Labs    01/25/17 1119 01/26/17 0502  HGB 12.5 9.8*  HCT 36.5 28.6*    Assessment/Plan: Plan for discharge tomorrow   LOS: 1 day   [name] 01/26/2017, 9:32 AM

## 2017-01-26 NOTE — Progress Notes (Signed)
Pt up to bathroom to void. Pt passed a clot about the size of an orange. Small-Moderate amount of bleeding noted in pad. Measured about of blood. Pt back to bed. Vitals signs WNL. Notified CNM. CNM came to see bleeding and pt. No new orders at this time.

## 2017-01-27 MED ORDER — IBUPROFEN 600 MG PO TABS: 600.0000 mg | ORAL_TABLET | Freq: Four times a day (QID) | ORAL | 0 refills | Status: DC

## 2017-01-27 NOTE — Discharge Summary (Signed)
OB Discharge Summary     Patient Name: [name] DOB: [birth year] MRN: [id number]  Date of admission: 01/25/2017 Delivering MD: Marthenia RollingBLAND, SCOTT   Date of discharge: 01/27/2017  Admitting diagnosis: 39 wks ctx every 5 min Intrauterine pregnancy: 3513w5d     Secondary diagnosis:  Active Problems:   Indication for care in labor or delivery  Additional problems: n/a     Discharge diagnosis: Term Pregnancy Delivered                                                                                                Post partum procedures:n/a  Augmentation: none  Complications: None  Hospital course:  Onset of Labor With Vaginal Delivery     26 y.o. yo [id number] at 2513w5d was admitted in Active Labor on 01/25/2017. Patient had an uncomplicated labor course as follows:  Membrane Rupture Time/Date: 11:20 AM ,01/25/2017   Intrapartum Procedures: Episiotomy: None [1]                                         Lacerations:  None [1]  Patient had a delivery of a Viable infant. 01/25/2017  Information for the patient's newborn:  Salvadore OxfordMobio, Girl Zenda Alpersian [[id number]][[id number]]  Delivery Method: Vaginal, Spontaneous(Filed from Delivery Summary)    Pateint had an uncomplicated postpartum course.  She is ambulating, tolerating a regular diet, passing flatus, and urinating well. Patient is discharged home in stable condition on 01/27/17.   Physical exam  Vitals:   01/25/17 1828 01/26/17 0048 01/26/17 0533 01/26/17 1749  BP: (!) 113/59 (!) 114/57 (!) 105/47 116/62  Pulse: 72 80 74 82  Resp:  18 18 17   Temp:  98.8 F (37.1 C) 98.1 F (36.7 C) 97.8 F (36.6 C)  TempSrc:  Oral Oral Oral  SpO2:  100%    Weight:      Height:       General: alert, cooperative and no distress Lochia: appropriate Uterine Fundus: firm Incision: N/A DVT Evaluation: No evidence of DVT seen on physical exam. Labs: Lab Results  Component Value Date   WBC 14.7 (H) 01/26/2017   HGB 9.8 (L) 01/26/2017   HCT 28.6 (L) 01/26/2017   MCV  79.2 01/26/2017   PLT 265 01/26/2017   CMP Latest Ref Rng & Units 01/16/2014  Glucose 70 - 99 mg/dL 93  BUN 6 - 23 mg/dL 12  Creatinine 1.300.50 - 8.651.10 mg/dL 7.840.62  Sodium 696137 - 295147 mEq/L 136(L)  Potassium 3.7 - 5.3 mEq/L 3.4(L)  Chloride 96 - 112 mEq/L 100  CO2 19 - 32 mEq/L 23  Calcium 8.4 - 10.5 mg/dL 9.2  Total Protein 6.0 - 8.3 g/dL 7.1  Total Bilirubin 0.3 - 1.2 mg/dL 0.4  Alkaline Phos 39 - 117 U/L 54  AST 0 - 37 U/L 14  ALT 0 - 35 U/L 13    Discharge instruction: per After Visit Summary and "Baby and Me Booklet".  After visit meds:  Allergies as of 01/27/2017  Reactions   Pollen Extract Other (See Comments)   Migraines      Medication List    STOP taking these medications   valACYclovir 1000 MG tablet Commonly known as:  VALTREX     TAKE these medications   acetaminophen 500 MG tablet Commonly known as:  TYLENOL Take 1,000 mg by mouth every 6 (six) hours as needed for mild pain.   COMFORT FIT MATERNITY SUPP LG Misc 1 Units daily by Does not apply route.   ibuprofen 600 MG tablet Commonly known as:  ADVIL,MOTRIN Take 1 tablet (600 mg total) by mouth every 6 (six) hours.   PRENATE PIXIE 10-0.6-0.4-200 MG Caps Take 1 tablet by mouth daily.   Vitamin D (Ergocalciferol) 50000 units Caps capsule Commonly known as:  DRISDOL Take 1 capsule (50,000 Units total) by mouth every 7 (seven) days.       Diet: routine diet  Activity: Advance as tolerated. Pelvic rest for 6 weeks.   Outpatient follow up:4 week Follow up Appt: Future Appointments  Date Time Provider Department Center  02/24/2017  8:30 AM Roe Coombsenney, Rachelle A, CNM [facility] None   Follow up Visit:No Follow-up on file.  Postpartum contraception: Progesterone only pills  Newborn Data: Live born female  Birth Weight: 5 lb 8.9 oz (2520 g) APGAR: ,   Newborn Delivery   Birth date/time:  01/25/2017 11:20:00 Delivery type:  Vaginal, Spontaneous     Baby Feeding: Breast Disposition:home with  mother   01/27/2017 Rolm Bookbinderaroline M Krishan Mcbreen, CNM

## 2017-01-27 NOTE — Discharge Instructions (Signed)
Postpartum Care After Vaginal Delivery °The period of time right after you deliver your newborn is called the postpartum period. °What kind of medical care will I receive? °· You may continue to receive fluids and medicines through an IV tube inserted into one of your veins. °· If an incision was made near your vagina (episiotomy) or if you had some vaginal tearing during delivery, cold compresses may be placed on your episiotomy or your tear. This helps to reduce pain and swelling. °· You may be given a squirt bottle to use when you go to the bathroom. You may use this until you are comfortable wiping as usual. To use the squirt bottle, follow these steps: °? Before you urinate, fill the squirt bottle with warm water. Do not use hot water. °? After you urinate, while you are sitting on the toilet, use the squirt bottle to rinse the area around your urethra and vaginal opening. This rinses away any urine and blood. °? You may do this instead of wiping. As you start healing, you may use the squirt bottle before wiping yourself. Make sure to wipe gently. °? Fill the squirt bottle with clean water every time you use the bathroom. °· You will be given sanitary pads to wear. °How can I expect to feel? °· You may not feel the need to urinate for several hours after delivery. °· You will have some soreness and pain in your abdomen and vagina. °· If you are breastfeeding, you may have uterine contractions every time you breastfeed for up to several weeks postpartum. Uterine contractions help your uterus return to its normal size. °· It is normal to have vaginal bleeding (lochia) after delivery. The amount and appearance of lochia is often similar to a menstrual period in the first week after delivery. It will gradually decrease over the next few weeks to a dry, yellow-brown discharge. For most women, lochia stops completely by 6-8 weeks after delivery. Vaginal bleeding can vary from woman to woman. °· Within the first few  days after delivery, you may have breast engorgement. This is when your breasts feel heavy, full, and uncomfortable. Your breasts may also throb and feel hard, tightly stretched, warm, and tender. After this occurs, you may have milk leaking from your breasts. Your health care provider can help you relieve discomfort due to breast engorgement. Breast engorgement should go away within a few days. °· You may feel more sad or worried than normal due to hormonal changes after delivery. These feelings should not last more than a few days. If these feelings do not go away after several days, speak with your health care provider. °How should I care for myself? °· Tell your health care provider if you have pain or discomfort. °· Drink enough water to keep your urine clear or pale yellow. °· Wash your hands thoroughly with soap and water for at least 20 seconds after changing your sanitary pads, after using the toilet, and before holding or feeding your baby. °· If you are not breastfeeding, avoid touching your breasts a lot. Doing this can make your breasts produce more milk. °· If you become weak or lightheaded, or you feel like you might faint, ask for help before: °? Getting out of bed. °? Showering. °· Change your sanitary pads frequently. Watch for any changes in your flow, such as a sudden increase in volume, a change in color, the passing of large blood clots. If you pass a blood clot from your vagina, save it   to show to your health care provider. Do not flush blood clots down the toilet without having your health care provider look at them. °· Make sure that all your vaccinations are up to date. This can help protect you and your baby from getting certain diseases. You may need to have immunizations done before you leave the hospital. °· If desired, talk with your health care provider about methods of family planning or birth control (contraception). °How can I start bonding with my baby? °Spending as much time as  possible with your baby is very important. During this time, you and your baby can get to know each other and develop a bond. Having your baby stay with you in your room (rooming in) can give you time to get to know your baby. Rooming in can also help you become comfortable caring for your baby. Breastfeeding can also help you bond with your baby. °How can I plan for returning home with my baby? °· Make sure that you have a car seat installed in your vehicle. °? Your car seat should be checked by a certified car seat installer to make sure that it is installed safely. °? Make sure that your baby fits into the car seat safely. °· Ask your health care provider any questions you have about caring for yourself or your baby. Make sure that you are able to contact your health care provider with any questions after leaving the hospital. °This information is not intended to replace advice given to you by your health care provider. Make sure you discuss any questions you have with your health care provider. °Document Released: 11/18/2006 Document Revised: 06/26/2015 Document Reviewed: 12/26/2014 °Elsevier Interactive Patient Education © 2018 [employer] ° °

## 2017-01-28 ENCOUNTER — Ambulatory Visit: Payer: Self-pay

## 2017-01-28 NOTE — Lactation Note (Signed)
This note was copied from a baby's chart. Lactation Consultation Note Mom states BF going well. Mom's milk is coming in. Mom is post pumping, giving BM as supplement.  Mom hopes to be d/c home today. Discussed importance of pumping and supplementing d/t baby's weight. Mom only has a hand pump. Has WIC. [facility] mom [facility], mom stated yes until informed $30.00 for 2 weeks, receives money back when returns pump. Mom stated no she would use the hand pump.  Discussed engorgement, management, supply and demand. Discussed I&O for baby. Stressed importance d/t weight and f/u. Encouraged to call for assistance if needed or has questions.  Patient Name: Girl August Luzian Calais ZOXWR'UToday's Date: 01/28/2017 Reason for consult: Follow-up assessment;Infant < 6lbs   Maternal Data    Feeding Feeding Type: Breast Milk Nipple Type: Slow - flow Length of feed: 15 min  LATCH Score Latch: Grasps breast easily, tongue down, lips flanged, rhythmical sucking.  Audible Swallowing: Spontaneous and intermittent  Type of Nipple: Everted at rest and after stimulation  Comfort (Breast/Nipple): Filling, red/small blisters or bruises, mild/mod discomfort  Hold (Positioning): No assistance needed to correctly position infant at breast.  LATCH Score: 9  Interventions Interventions: Breast feeding basics reviewed;Breast compression;Hand pump;Support pillows;Position options  Lactation Tools Discussed/Used Tools: Pump Breast pump type: Manual   Consult Status Consult Status: Complete Date: 01/28/17    Charyl DancerCARVER, Makenze Ellett G 01/28/2017, 4:36 AM

## 2017-01-31 ENCOUNTER — Encounter: Payer: Self-pay | Admitting: Obstetrics

## 2017-02-01 ENCOUNTER — Other Ambulatory Visit: Payer: Self-pay | Admitting: Family Medicine

## 2017-02-05 ENCOUNTER — Inpatient Hospital Stay (HOSPITAL_COMMUNITY): Payer: Medicaid Other

## 2017-02-24 ENCOUNTER — Ambulatory Visit: Payer: Self-pay | Admitting: Certified Nurse Midwife

## 2017-02-25 ENCOUNTER — Ambulatory Visit: Payer: Self-pay | Admitting: Certified Nurse Midwife

## 2017-03-04 ENCOUNTER — Ambulatory Visit: Payer: Self-pay | Admitting: Certified Nurse Midwife

## 2017-04-23 ENCOUNTER — Encounter (HOSPITAL_COMMUNITY): Payer: Self-pay

## 2017-06-10 ENCOUNTER — Telehealth: Payer: Self-pay

## 2017-06-10 NOTE — Telephone Encounter (Signed)
Attempted to return call, no answer, left vm.

## 2017-12-22 IMAGING — US US OB TRANSVAGINAL
1 series · 15 of 28 positions shown · non-contrast
Comparison: 05/18/2016

CLINICAL DATA: Viability

EXAM:
TRANSVAGINAL OB ULTRASOUND
TECHNIQUE: Transvaginal ultrasound was performed for complete evaluation of the
gestation as well as the maternal uterus, adnexal regions, and
pelvic cul-de-sac.

[Series 1: us ob transvaginal · 15 of 37 slices shown]
[im 1/37]
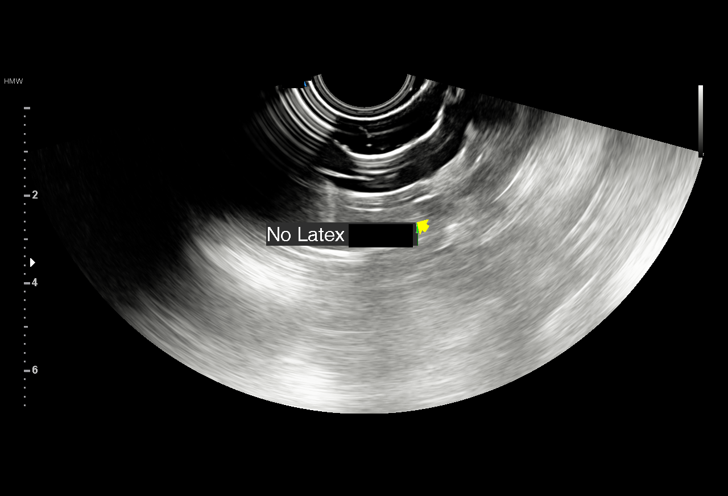
[im 3/37]
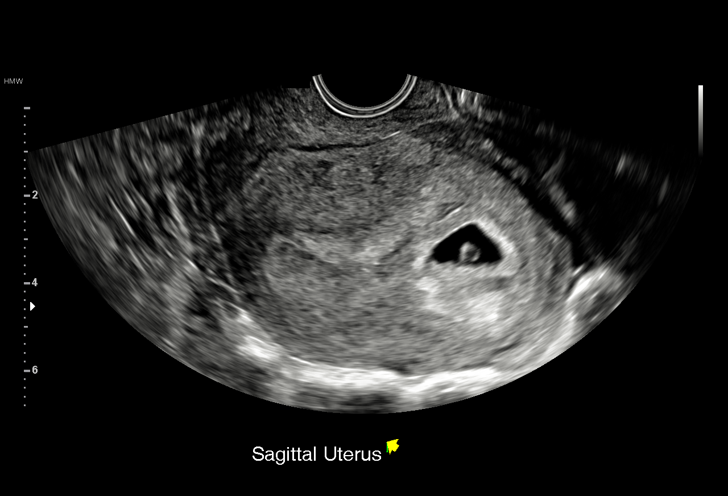
[im 6/37]
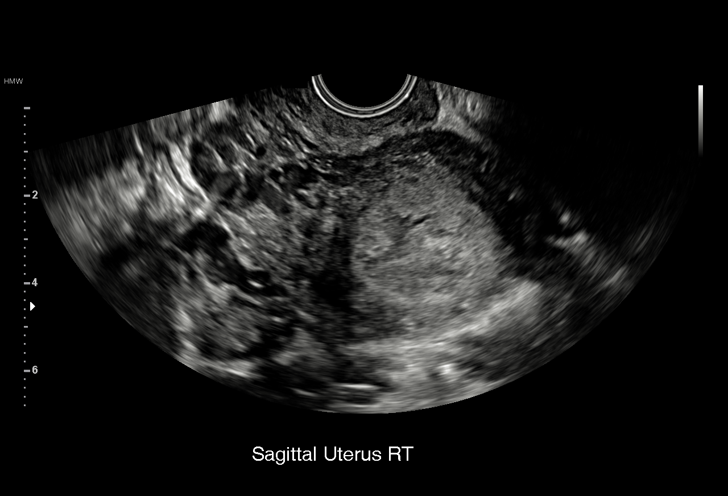
[im 9/37]
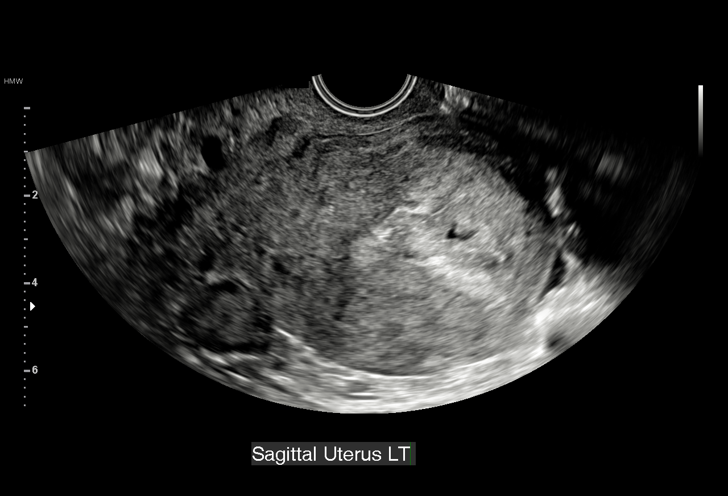
[im 11/37]
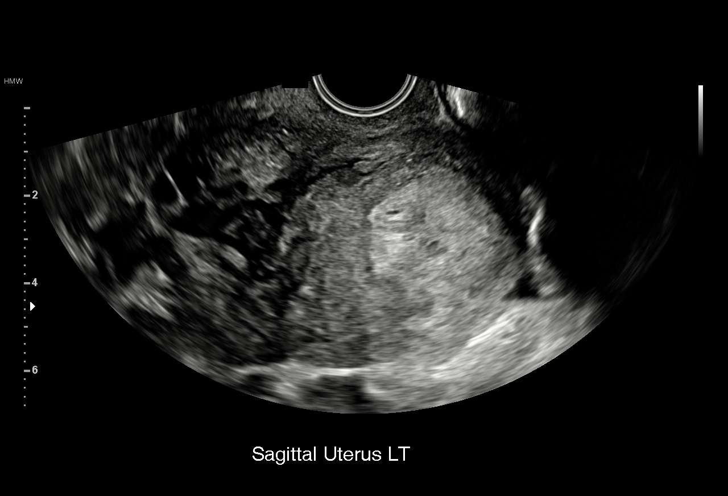
[im 14/37]
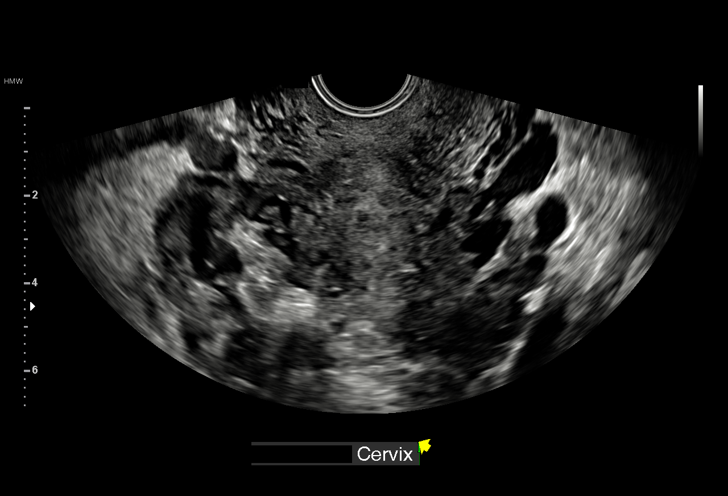
[im 17/37]
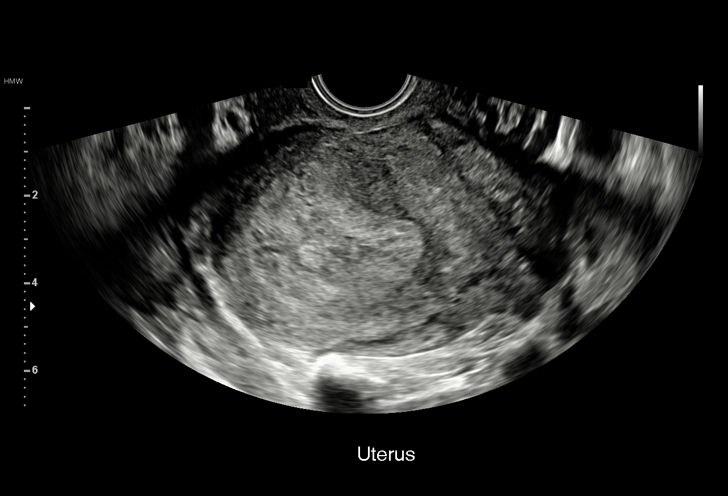
[im 19/37]
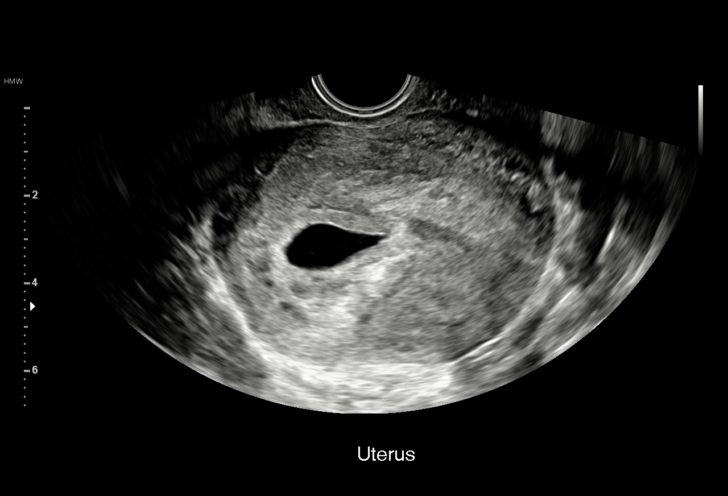
[im 21/37]
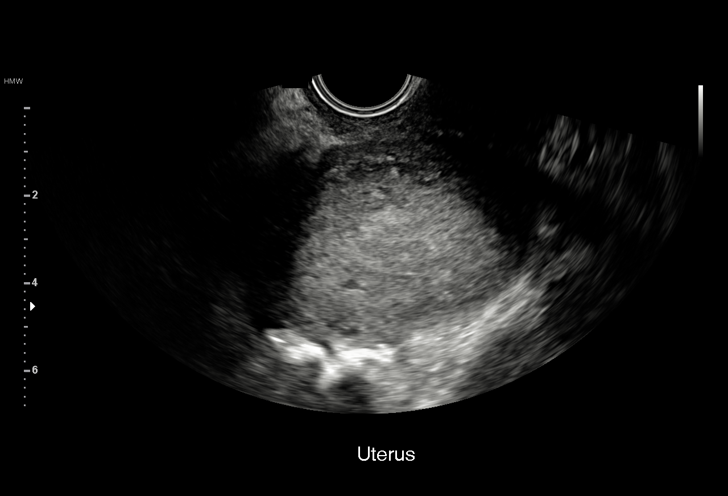
[im 23/37]
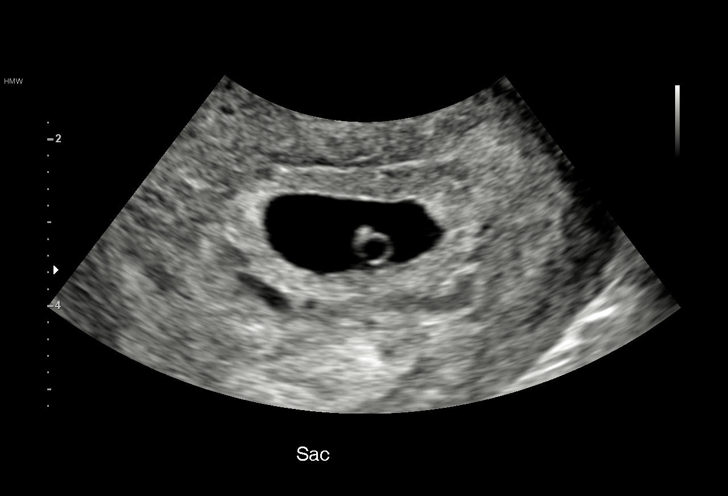
[im 26/37]
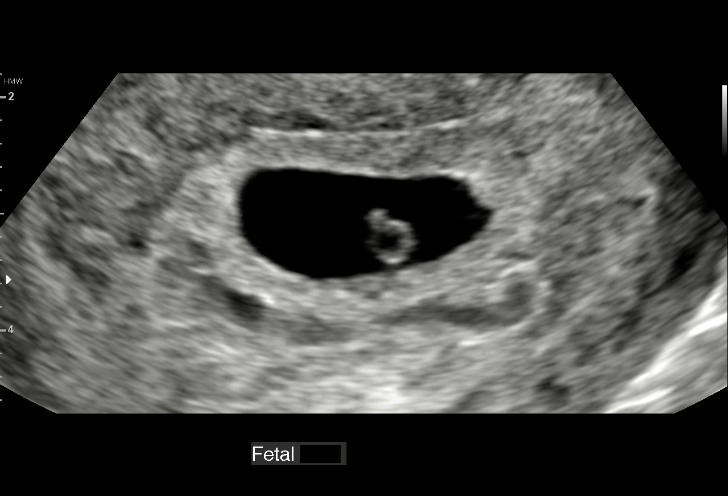
[im 29/37]
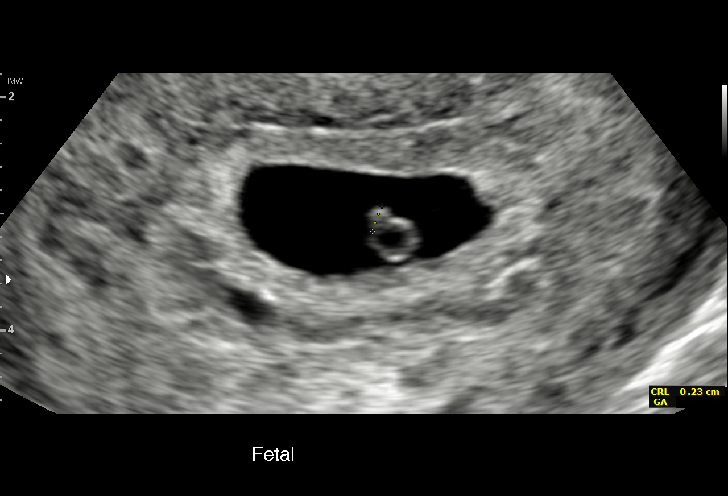
[im 31/37]
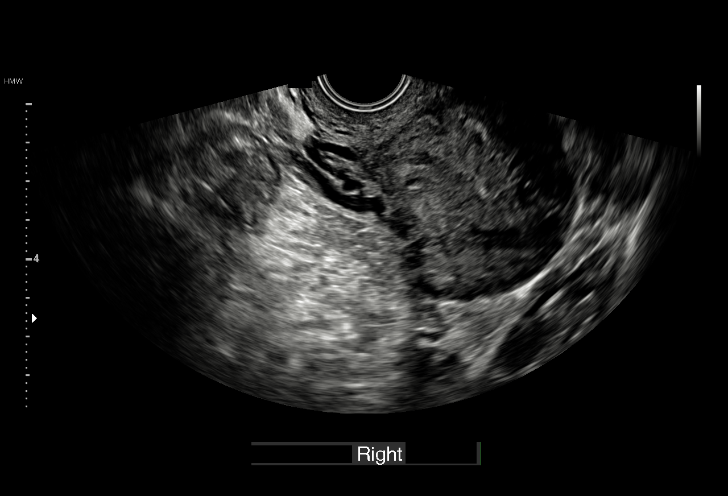
[im 34/37]
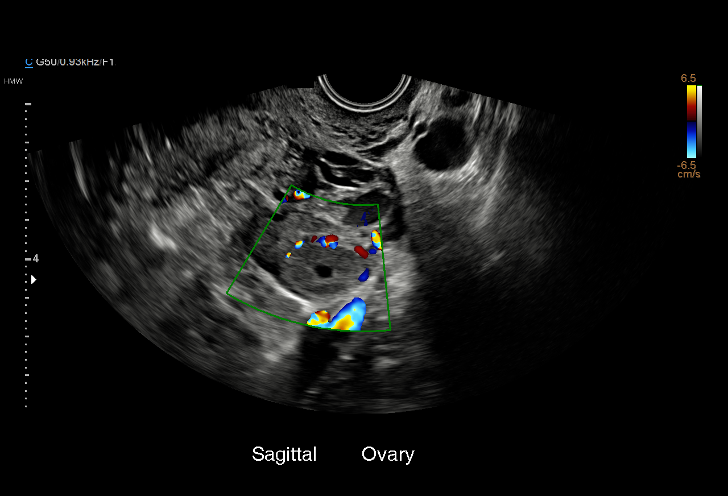
[im 37/37]
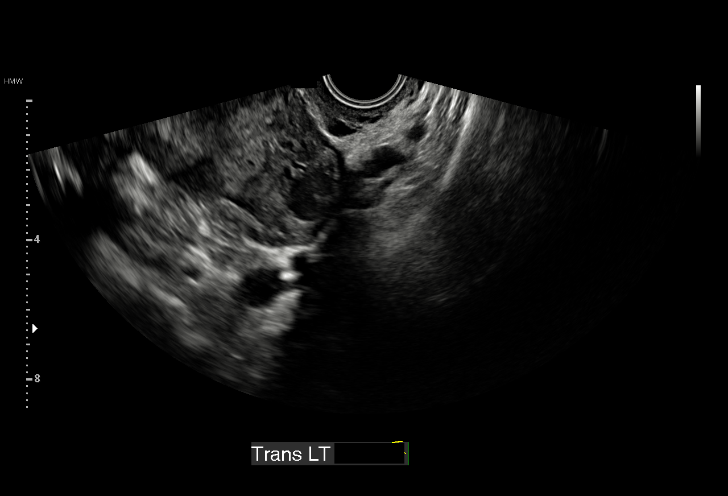

[15 of 28 positions shown; findings below may reference images not displayed]

FINDINGS: Intrauterine gestational sac: Single

Yolk sac:  Visualized

Embryo:  Visualized

Cardiac Activity: Visualized

Heart Rate: 90 bpm

MSD:   mm    w     d

CRL:   2.2  mm   5 w 5 d                  US EDC: 01/29/2017

Subchorionic hemorrhage:  Small subchorionic hemorrhage

Maternal uterus/adnexae: No adnexal masses or free fluid.
IMPRESSION: Five week 5 day intrauterine pregnancy. Fetal bradycardia at 90
beats per minute. This could be related to the very early age of
gestation. This could be followed with repeat ultrasound in 14 days
to ensure expected progression.

Small subchorionic hemorrhage.

## 2018-02-04 NOTE — L&D Delivery Note (Signed)
OB/GYN Faculty Practice Delivery Note  Clarabelle Oscarson is a 28 y.o. [id number] s/p vaginal delivery at [redacted]w[redacted]d. She was admitted for elective IOL.   ROM: 0h 25m with clear fluid GBS Status: negative Maximum Maternal Temperature: 98.5  Labor Progress: . Patient was admitted for elective IOL. She was given cytotec and pitocin was started approximately 4 hours later. She received her epidural, SROMed, and was subsequently completely dilated.  Delivery Date/Time: 10/06/18, 1003 Delivery: Called to room and patient was complete and pushing. Head delivered ROA. There was one nuchal, which was loose and easily reduced. Shoulder and body delivered in usual fashion. Infant with spontaneous cry, placed on mother's abdomen, dried and stimulated. Cord clamped x 2 after 1-minute delay, and cut by the mother under my direct supervision. Cord blood drawn. Placenta delivered spontaneously with gentle cord traction. Fundus firm with massage and Pitocin. Labia, perineum, vagina, and cervix inspected inspected and found to be intact.   Placenta: intact Complications: none Lacerations: none EBL: 300 mL Analgesia: Epidural  Postpartum Planning [x]  message to sent to schedule follow-up  [x]  vaccines UTD  Infant: female  APGARs 9 and 9  3005 g  Merilyn Baba, DO OB/GYN Fellow, Faculty Practice

## 2018-02-14 ENCOUNTER — Encounter (HOSPITAL_COMMUNITY): Payer: Self-pay | Admitting: *Deleted

## 2018-02-14 ENCOUNTER — Inpatient Hospital Stay (HOSPITAL_COMMUNITY): Payer: Medicaid Other

## 2018-02-14 ENCOUNTER — Inpatient Hospital Stay (HOSPITAL_COMMUNITY)
Admission: AD | Admit: 2018-02-14 | Discharge: 2018-02-14 | Disposition: A | Discharge status: Home / Self Care | Payer: Medicaid Other | Source: Ambulatory Visit | Attending: Family Medicine | Admitting: Family Medicine

## 2018-02-14 ENCOUNTER — Other Ambulatory Visit: Payer: Self-pay

## 2018-02-14 DIAGNOSIS — Z3A01 Less than 8 weeks gestation of pregnancy: Secondary | ICD-10-CM

## 2018-02-14 DIAGNOSIS — O208 Other hemorrhage in early pregnancy: Secondary | ICD-10-CM | POA: Diagnosis not present

## 2018-02-14 DIAGNOSIS — O468X1 Other antepartum hemorrhage, first trimester: Secondary | ICD-10-CM

## 2018-02-14 DIAGNOSIS — O26891 Other specified pregnancy related conditions, first trimester: Secondary | ICD-10-CM | POA: Diagnosis not present

## 2018-02-14 DIAGNOSIS — R109 Unspecified abdominal pain: Secondary | ICD-10-CM

## 2018-02-14 DIAGNOSIS — O210 Mild hyperemesis gravidarum: Secondary | ICD-10-CM | POA: Insufficient documentation

## 2018-02-14 DIAGNOSIS — O418X10 Other specified disorders of amniotic fluid and membranes, first trimester, not applicable or unspecified: Secondary | ICD-10-CM

## 2018-02-14 DIAGNOSIS — O26851 Spotting complicating pregnancy, first trimester: Secondary | ICD-10-CM | POA: Diagnosis not present

## 2018-02-14 DIAGNOSIS — O26899 Other specified pregnancy related conditions, unspecified trimester: Secondary | ICD-10-CM

## 2018-02-14 LAB — CBC WITH DIFFERENTIAL/PLATELET
BASOS PCT: 0 %
Basophils Absolute: 0 10*3/uL (ref 0.0–0.1)
Eosinophils Absolute: 0.2 10*3/uL (ref 0.0–0.5)
Eosinophils Relative: 3 %
HCT: 37.5 % (ref 36.0–46.0)
HEMOGLOBIN: 12.3 g/dL (ref 12.0–15.0)
LYMPHS ABS: 1.6 10*3/uL (ref 0.7–4.0)
LYMPHS PCT: 29 %
MCH: 26.1 pg (ref 26.0–34.0)
MCHC: 32.8 g/dL (ref 30.0–36.0)
MCV: 79.4 fL — AB (ref 80.0–100.0)
Monocytes Absolute: 0.3 10*3/uL (ref 0.1–1.0)
Monocytes Relative: 6 %
NEUTROS ABS: 3.5 10*3/uL (ref 1.7–7.7)
NEUTROS PCT: 62 %
NRBC: 0 % (ref 0.0–0.2)
Platelets: 280 10*3/uL (ref 150–400)
RBC: 4.72 MIL/uL (ref 3.87–5.11)
RDW: 12.7 % (ref 11.5–15.5)
WBC: 5.6 10*3/uL (ref 4.0–10.5)

## 2018-02-14 LAB — URINALYSIS, ROUTINE W REFLEX MICROSCOPIC
Bacteria, UA: NONE SEEN
Bilirubin Urine: NEGATIVE
GLUCOSE, UA: NEGATIVE mg/dL
Hgb urine dipstick: NEGATIVE
Ketones, ur: NEGATIVE mg/dL
Leukocytes, UA: NEGATIVE
Nitrite: NEGATIVE
PH: 5 (ref 5.0–8.0)
Protein, ur: 30 mg/dL — AB
SPECIFIC GRAVITY, URINE: 1.035 — AB (ref 1.005–1.030)

## 2018-02-14 LAB — WET PREP, GENITAL
Clue Cells Wet Prep HPF POC: NONE SEEN
SPERM: NONE SEEN
TRICH WET PREP: NONE SEEN
YEAST WET PREP: NONE SEEN

## 2018-02-14 LAB — HCG, QUANTITATIVE, PREGNANCY: hCG, Beta Chain, Quant, S: 47618 m[IU]/mL — ABNORMAL HIGH (ref <5)

## 2018-02-14 LAB — POCT PREGNANCY, URINE: Preg Test, Ur: POSITIVE — AB

## 2018-02-14 MED ORDER — PROMETHAZINE HCL 12.5 MG PO TABS: 12.5000 mg | ORAL_TABLET | Freq: Four times a day (QID) | ORAL | 0 refills | Status: DC | PRN

## 2018-02-14 NOTE — Discharge Instructions (Signed)
Subchorionic Hematoma ° °A subchorionic hematoma is a gathering of blood between the outer wall of the embryo (chorion) and the inner wall of the womb (uterus). °This condition can cause vaginal bleeding. If they cause little or no vaginal bleeding, early small hematomas usually shrink on their own and do not affect your baby or pregnancy. When bleeding starts later in pregnancy, or if the hematoma is larger or occurs in older pregnant women, the condition may be more serious. Larger hematomas may get bigger, which increases the chances of miscarriage. This condition also increases the risk of: °· Premature separation of the placenta from the uterus. °· Premature (preterm) labor. °· Stillbirth. °What are the causes? °The exact cause of this condition is not known. It occurs when blood is trapped between the placenta and the uterine wall because the placenta has separated from the original site of implantation. °What increases the risk? °You are more likely to develop this condition if: °· You were treated with fertility medicines. °· You conceived through in vitro fertilization (IVF). °What are the signs or symptoms? °Symptoms of this condition include: °· Vaginal spotting or bleeding. °· Contractions of the uterus. These cause abdominal pain. °Sometimes you may have no symptoms and the bleeding may only be seen when ultrasound images are taken (transvaginal ultrasound). °How is this diagnosed? °This condition is diagnosed based on a physical exam. This includes a pelvic exam. You may also have other tests, including: °· Blood tests. °· Urine tests. °· Ultrasound of the abdomen. °How is this treated? °Treatment for this condition can vary. Treatment may include: °· Watchful waiting. You will be monitored closely for any changes in bleeding. During this stage: °? The hematoma may be reabsorbed by the body. °? The hematoma may separate the fluid-filled space containing the embryo (gestational sac) from the wall of the  womb (endometrium). °· Medicines. °· Activity restriction. This may be needed until the bleeding stops. °Follow these instructions at home: °· Stay on bed rest if told to do so by your health care provider. °· Do not lift anything that is heavier than 10 lbs. (4.5 kg) or as told by your health care provider. °· Do not use any products that contain nicotine or tobacco, such as cigarettes and e-cigarettes. If you need help quitting, ask your health care provider. °· Track and write down the number of pads you use each day and how soaked (saturated) they are. °· Do not use tampons. °· Keep all follow-up visits as told by your health care provider. This is important. Your health care provider may ask you to have follow-up blood tests or ultrasound tests or both. °Contact a health care provider if: °· You have any vaginal bleeding. °· You have a fever. °Get help right away if: °· You have severe cramps in your stomach, back, abdomen, or pelvis. °· You pass large clots or tissue. Save any tissue for your health care provider to look at. °· You have more vaginal bleeding, and you faint or become lightheaded or weak. °Summary °· A subchorionic hematoma is a gathering of blood between the outer wall of the placenta and the uterus. °· This condition can cause vaginal bleeding. °· Sometimes you may have no symptoms and the bleeding may only be seen when ultrasound images are taken. °· Treatment may include watchful waiting, medicines, or activity restriction. °This information is not intended to replace advice given to you by your health care provider. Make sure you discuss any questions you   have with your health care provider. Document Released: 05/08/2006 Document Revised: 03/19/2016 Document Reviewed: 03/19/2016 Elsevier Interactive Patient Education  2019 [employer].  Center for [employer] Prenatal [facility] Providers          Center for [facility] @ [facility]   Phone: [phone]  Center for  [facility] @ [facility]   Phone: [phone]  Center For [facility]  [facility]       Phone: [phone]            Center for [facility] @ [place]     Phone: [phone]          Center for [facility] @ [employer]   Phone: [phone]  Center for [facility] @ [facility]  Phone: [phone]     Family [address] [place])  Phone: [phone]

## 2018-02-14 NOTE — MAU Provider Note (Signed)
History     CSN: [id number]  Arrival date and time: 02/14/18 [id number]    First Provider Initiated Contact with Patient 02/14/18 1831         Chief Complaint  Patient presents with  . Vaginal Bleeding  . Abdominal Pain  . Possible Pregnancy   HPI  [name] is a 28 y.o. [phone] [id number] at 7570w6d who presents to MAU today with complaint of abdominal pain and spotting since yesterday. She also has N/V, but none today. She denies UTI symptoms, fever, diarrhea, constipation today. She has not taken anything for pain.   OB History    Gravida  7   Para  4   Term  4   Preterm  0   AB  2   Living  4     SAB  0   TAB  1   Ectopic  1   Multiple  0   Live Births  4           Past Medical History:  Diagnosis Date  . Anemia     Past Surgical History:  Procedure Laterality Date  . NO PAST SURGERIES      Family History  Problem Relation Age of Onset  . Diabetes Mother   . Hyperlipidemia Mother   . Hypertension Mother     Social History   Tobacco Use  . Smoking status: Never Smoker  . Smokeless tobacco: Never Used  Substance Use Topics  . Alcohol use: No  . Drug use: No    Allergies:  Allergies  Allergen Reactions  . Pollen Extract Other (See Comments)    Migraines    No medications prior to admission.    Review of Systems  Constitutional: Negative for fever.  Gastrointestinal: Positive for abdominal pain, nausea and vomiting. Negative for constipation and diarrhea.  Genitourinary: Positive for vaginal bleeding. Negative for dysuria, frequency, urgency and vaginal discharge.   Physical Exam   Blood pressure 119/70, pulse 83, temperature 98.4 F (36.9 C), temperature source Oral, resp. rate 18, weight 76.7 kg, last menstrual period 12/28/2017, SpO2 98 %, unknown if currently breastfeeding.  Physical Exam  Nursing note and vitals reviewed. Constitutional: She is oriented to person, place, and time. She appears well-developed and well-nourished.  No distress.  HENT:  Head: Normocephalic and atraumatic.  Cardiovascular: Normal rate.  Respiratory: Effort normal.  GI: Soft. She exhibits no distension and no mass. There is abdominal tenderness (mild tenderness to palpation in the lower abdomen bilaterally). There is no rebound and no guarding.  Genitourinary: Uterus is not enlarged and not tender. Cervix exhibits no motion tenderness, no discharge and no friability. Right adnexum displays no mass and no tenderness. Left adnexum displays no mass and no tenderness.    No vaginal discharge or bleeding.  No bleeding in the vagina.  Neurological: She is alert and oriented to person, place, and time.  Skin: Skin is warm and dry. No erythema.  Psychiatric: She has a normal mood and affect.     Results for orders placed or performed during the hospital encounter of 02/14/18 (from the past 24 hour(s))  Urinalysis, Routine w reflex microscopic     Status: Abnormal   Collection Time: 02/14/18  6:22 PM  Result Value Ref Range   Color, Urine AMBER (A) YELLOW   APPearance HAZY (A) CLEAR   Specific Gravity, Urine 1.035 (H) 1.005 - 1.030   pH 5.0 5.0 - 8.0   Glucose, UA NEGATIVE NEGATIVE mg/dL  Hgb urine dipstick NEGATIVE NEGATIVE   Bilirubin Urine NEGATIVE NEGATIVE   Ketones, ur NEGATIVE NEGATIVE mg/dL   Protein, ur 30 (A) NEGATIVE mg/dL   Nitrite NEGATIVE NEGATIVE   Leukocytes, UA NEGATIVE NEGATIVE   RBC / HPF 0-5 0 - 5 RBC/hpf   WBC, UA 0-5 0 - 5 WBC/hpf   Bacteria, UA NONE SEEN NONE SEEN   Squamous Epithelial / LPF 6-10 0 - 5   Mucus PRESENT    Sperm, UA PRESENT   Pregnancy, urine POC     Status: Abnormal   Collection Time: 02/14/18  6:24 PM  Result Value Ref Range   Preg Test, Ur POSITIVE (A) NEGATIVE  Wet prep, genital     Status: Abnormal   Collection Time: 02/14/18  6:43 PM  Result Value Ref Range   Yeast Wet Prep HPF POC NONE SEEN NONE SEEN   Trich, Wet Prep NONE SEEN NONE SEEN   Clue Cells Wet Prep HPF POC NONE SEEN NONE  SEEN   WBC, Wet Prep HPF POC FEW (A) NONE SEEN   Sperm NONE SEEN   CBC with Differential/Platelet     Status: Abnormal   Collection Time: 02/14/18  7:31 PM  Result Value Ref Range   WBC 5.6 4.0 - 10.5 K/uL   RBC 4.72 3.87 - 5.11 MIL/uL   Hemoglobin 12.3 12.0 - 15.0 g/dL   HCT 04.537.5 40.936.0 - 81.146.0 %   MCV 79.4 (L) 80.0 - 100.0 fL   MCH 26.1 26.0 - 34.0 pg   MCHC 32.8 30.0 - 36.0 g/dL   RDW 91.412.7 78.211.5 - 95.615.5 %   Platelets 280 150 - 400 K/uL   nRBC 0.0 0.0 - 0.2 %   Neutrophils Relative % 62 %   Neutro Abs 3.5 1.7 - 7.7 K/uL   Lymphocytes Relative 29 %   Lymphs Abs 1.6 0.7 - 4.0 K/uL   Monocytes Relative 6 %   Monocytes Absolute 0.3 0.1 - 1.0 K/uL   Eosinophils Relative 3 %   Eosinophils Absolute 0.2 0.0 - 0.5 K/uL   Basophils Relative 0 %   Basophils Absolute 0.0 0.0 - 0.1 K/uL  hCG, quantitative, pregnancy     Status: Abnormal   Collection Time: 02/14/18  7:31 PM  Result Value Ref Range   hCG, Beta Chain, [name], S 47,618 (H) <5 mIU/mL    [place] Ob Comp Less 14 Wks  Result Date: 02/14/2018 CLINICAL DATA:  Abdominal pain and spotting. Estimated gestational age by LMP is 6 weeks 6 days. Quantitative beta HCG is not provided. EXAM: OBSTETRIC <14 WK US AND TRANSVAGINAL OB US TECHNIQUE: Both transabdominal and transvaginal ultrasound examinations were performed for complete evaluation of the gestation as well as the maternal uterus, adnexal regions, and pelvic cul-de-sac. Transvaginal technique was performed to assess early pregnancy. COMPARISON:  None. FINDINGS: Intrauterine gestational sac: A single intrauterine gestational sac is present. Yolk sac: Yolk sac is poorly demonstrated but appears to be present. Embryo:  Fetal pole is present. Cardiac Activity: Fetal cardiac activity is observed. Heart Rate: 116 bpm CRL:  4.3 mm   6 w   1 d                  US EDC: 10/09/2018 Subchorionic hemorrhage: Small subchorionic hemorrhage demonstrated laterally to the right. Maternal uterus/adnexae: Uterus is  retroflexed. No myometrial mass lesions identified. Both ovaries are visualized and appear normal. No free fluid. IMPRESSION: Single intrauterine pregnancy. Estimated gestational age by crown-rump length is 6  weeks 1 day. Small subchorionic hemorrhage. Electronically Signed   By: Burman Nieves M.D.   On: 02/14/2018 20:12   US Ob Transvaginal  Result Date: 02/14/2018 CLINICAL DATA:  Abdominal pain and spotting. Estimated gestational age by LMP is 6 weeks 6 days. Quantitative beta HCG is not provided. EXAM: OBSTETRIC <14 WK [place] AND TRANSVAGINAL OB US TECHNIQUE: Both transabdominal and transvaginal ultrasound examinations were performed for complete evaluation of the gestation as well as the maternal uterus, adnexal regions, and pelvic cul-de-sac. Transvaginal technique was performed to assess early pregnancy. COMPARISON:  None. FINDINGS: Intrauterine gestational sac: A single intrauterine gestational sac is present. Yolk sac: Yolk sac is poorly demonstrated but appears to be present. Embryo:  Fetal pole is present. Cardiac Activity: Fetal cardiac activity is observed. Heart Rate: 116 bpm CRL:  4.3 mm   6 w   1 d                  [place] EDC: 10/09/2018 Subchorionic hemorrhage: Small subchorionic hemorrhage demonstrated laterally to the right. Maternal uterus/adnexae: Uterus is retroflexed. No myometrial mass lesions identified. Both ovaries are visualized and appear normal. No free fluid. IMPRESSION: Single intrauterine pregnancy. Estimated gestational age by crown-rump length is 6 weeks 1 day. Small subchorionic hemorrhage. Electronically Signed   By: Burman Nieves M.D.   On: 02/14/2018 20:12     MAU Course  Procedures None  MDM +UPT UA, wet prep, GC/chlamydia, CBC, quant hCG, HIV, RPR and [place] today to rule out ectopic pregnancy B+ blood type   Assessment and Plan  A: SIUP at [redacted]w[redacted]d Small subchorionic hemorrhage Nausea and vomiting in pregnancy   P: Discharge home Rx for Phenergan sent to  patient's pharmacy  First trimester/bleeding precautions discussed Patient advised to follow-up with OB provider of choice, list given  Patient may return to MAU as needed or if her condition were to change or worsen   Vonzella Nipple, PA-C 02/14/2018, 9:14 PM

## 2018-02-14 NOTE — MAU Note (Signed)
+  HPT 1/2.  Started bleeding and cramping yesterday.  No bleeding today, small amt;mild cramping today.

## 2018-02-15 LAB — RPR: RPR: NONREACTIVE

## 2018-02-15 LAB — HIV ANTIBODY (ROUTINE TESTING W REFLEX): HIV Screen 4th Generation wRfx: NONREACTIVE

## 2018-02-16 LAB — GC/CHLAMYDIA PROBE AMP (~~LOC~~) NOT AT ARMC
CHLAMYDIA, DNA PROBE: NEGATIVE
NEISSERIA GONORRHEA: NEGATIVE

## 2018-03-14 ENCOUNTER — Encounter (HOSPITAL_COMMUNITY): Payer: Self-pay

## 2018-03-14 ENCOUNTER — Inpatient Hospital Stay (HOSPITAL_COMMUNITY)
Admission: AD | Admit: 2018-03-14 | Discharge: 2018-03-14 | Disposition: A | Discharge status: Home / Self Care | Payer: Medicaid Other | Source: Ambulatory Visit | Attending: Obstetrics and Gynecology | Admitting: Obstetrics and Gynecology

## 2018-03-14 DIAGNOSIS — O219 Vomiting of pregnancy, unspecified: Secondary | ICD-10-CM | POA: Diagnosis not present

## 2018-03-14 DIAGNOSIS — O468X1 Other antepartum hemorrhage, first trimester: Secondary | ICD-10-CM

## 2018-03-14 DIAGNOSIS — Z3A10 10 weeks gestation of pregnancy: Secondary | ICD-10-CM | POA: Diagnosis not present

## 2018-03-14 DIAGNOSIS — O210 Mild hyperemesis gravidarum: Secondary | ICD-10-CM | POA: Insufficient documentation

## 2018-03-14 DIAGNOSIS — O208 Other hemorrhage in early pregnancy: Secondary | ICD-10-CM | POA: Insufficient documentation

## 2018-03-14 DIAGNOSIS — O418X10 Other specified disorders of amniotic fluid and membranes, first trimester, not applicable or unspecified: Secondary | ICD-10-CM | POA: Diagnosis not present

## 2018-03-14 DIAGNOSIS — O26851 Spotting complicating pregnancy, first trimester: Secondary | ICD-10-CM | POA: Diagnosis present

## 2018-03-14 LAB — URINALYSIS, ROUTINE W REFLEX MICROSCOPIC
BILIRUBIN URINE: NEGATIVE
Glucose, UA: NEGATIVE mg/dL
Hgb urine dipstick: NEGATIVE
Ketones, ur: NEGATIVE mg/dL
Leukocytes, UA: NEGATIVE
Nitrite: NEGATIVE
Protein, ur: NEGATIVE mg/dL
Specific Gravity, Urine: 1.025 (ref 1.005–1.030)
pH: 6.5 (ref 5.0–8.0)

## 2018-03-14 MED ORDER — PROMETHAZINE HCL 12.5 MG PO TABS: 12.5000 mg | ORAL_TABLET | Freq: Four times a day (QID) | ORAL | 0 refills | Status: DC | PRN

## 2018-03-14 NOTE — MAU Provider Note (Signed)
History     CSN: [id number]  Arrival date and time: 03/14/18 1211   First Provider Initiated Contact with Patient 03/14/18 1331      Chief Complaint  Patient presents with  . Vaginal Bleeding  . Abdominal Pain   HPI [name] is a 28 y.o. [id number] at [redacted]w[redacted]d who presents to MAU today with complaint of spotting and cramping off and on since yesterday. The patient was seen 1/11 and had SIUP noted on [place] with small [facility]. She states brown spotting yesterday and pink today. No heavy bleeding noted. 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 She rates pain at 6/10 now. She has not taken anything for pain. She denies recent intercourse, abnormal discharge or fever. She continues to have N/V, but has not filled the Rx for Phenergan given at last visit.   OB History    Gravida  7   Para  4   Term  4   Preterm  0   AB  2   Living  4     SAB  0   TAB  1   Ectopic  1   Multiple  0   Live Births  4           Past Medical History:  Diagnosis Date  . Anemia     Past Surgical History:  Procedure Laterality Date  . NO PAST SURGERIES      Family History  Problem Relation Age of Onset  . Diabetes Mother   . Hyperlipidemia Mother   . Hypertension Mother     Social History   Tobacco Use  . Smoking status: Never Smoker  . Smokeless tobacco: Never Used  Substance Use Topics  . Alcohol use: No  . Drug use: No    Allergies:  Allergies  Allergen Reactions  . Pollen Extract Other (See Comments)    Migraines    Medications Prior to Admission  Medication Sig Dispense Refill Last Dose  . acetaminophen (TYLENOL) 500 MG tablet Take 1,000 mg by mouth every 6 (six) hours as needed for mild pain.   01/25/2017 at Unknown time  . Elastic Bandages & Supports (COMFORT FIT MATERNITY SUPP LG) MISC 1 Units daily by Does not apply route. 1 each 0 Taking  . Prenat-FeAsp-Meth-FA-DHA w/o A (PRENATE PIXIE) 10-0.6-0.4-200 MG CAPS Take  1 tablet by mouth daily. 30 capsule 12 01/24/2017 at Unknown time  . Vitamin D, Ergocalciferol, (DRISDOL) 50000 units CAPS capsule Take 1 capsule (50,000 Units total) by mouth every 7 (seven) days. 30 capsule 2 Past Month at Unknown time  . [DISCONTINUED] promethazine (PHENERGAN) 12.5 MG tablet Take 1 tablet (12.5 mg total) by mouth every 6 (six) hours as needed for nausea or vomiting. 30 tablet 0     Review of Systems  Constitutional: Negative for fever.  Gastrointestinal: Positive for abdominal pain, nausea and vomiting. Negative for constipation and diarrhea.  Genitourinary: Positive for vaginal bleeding and vaginal discharge. Negative for dysuria, frequency and urgency.   Physical Exam   Blood pressure 106/70, pulse 80, temperature 98.4 F (36.9 C), temperature source Oral, resp. rate 17, height 5\' 4"  (1.626 m), weight 77.6 kg, last menstrual period 12/28/2017, SpO2 100 %, unknown if currently breastfeeding.  Physical Exam  Nursing note and vitals reviewed. Constitutional: She is oriented to person, place, and time. She appears well-developed and well-nourished. No distress.  HENT:  Head: Normocephalic and atraumatic.  Cardiovascular: Normal rate.  Respiratory: Effort normal.  GI: Soft. She exhibits no distension and no mass. There is abdominal tenderness (very mild lower abdomen bilateral). There is no rebound and no guarding.  Genitourinary: Uterus is enlarged (appropriate for GA). Uterus is not tender. Cervix exhibits no motion tenderness and no friability. Right adnexum displays no mass and no tenderness. Left adnexum displays no mass and no tenderness.    Vaginal discharge (small white non-odorous) present.     No vaginal bleeding.  No bleeding in the vagina.  Neurological: She is alert and oriented to person, place, and time.  Skin: Skin is warm and dry. No erythema.  Psychiatric: She has a normal mood and affect.   Results for orders placed or performed during the hospital  encounter of 03/14/18 (from the past 24 hour(s))  Urinalysis, Routine w reflex microscopic     Status: None   Collection Time: 03/14/18  1:10 PM  Result Value Ref Range   Color, Urine YELLOW YELLOW   APPearance CLEAR CLEAR   Specific Gravity, Urine 1.025 1.005 - 1.030   pH 6.5 5.0 - 8.0   Glucose, UA NEGATIVE NEGATIVE mg/dL   Hgb urine dipstick NEGATIVE NEGATIVE   Bilirubin Urine NEGATIVE NEGATIVE   Ketones, ur NEGATIVE NEGATIVE mg/dL   Protein, ur NEGATIVE NEGATIVE mg/dL   Nitrite NEGATIVE NEGATIVE   Leukocytes, UA NEGATIVE NEGATIVE     MAU Course  Procedures Pt informed that the ultrasound is considered a limited OB ultrasound and is not intended to be a complete ultrasound exam.  Patient also informed that the ultrasound is not being completed with the intent of assessing for fetal or placental anomalies or any pelvic abnormalities.  Explained that the purpose of today's ultrasound is to assess for  viability.  Patient acknowledges the purpose of the exam and the limitations of the study.   +FM, FHR with doppler 160 bpm  MDM Wet prep and GC/chlamydia negative < 1 month ago, no recent intercourse No bleeding on exam. Cervix closed.  US showed viable SIUP with normal FHR.  Spotting likely from previously seen subchorionic hemorrhage  Assessment and Plan  A: SIUP at [redacted]w[redacted]d Small subchorionic hemorrhage  Nausea and vomiting in pregnancy prior to [redacted] weeks gestation   P:  Discharge home Rx for Phenergan resent to pharmacy of choice since patient did not pick up Bleeding precautions and pelvic rest discussed Patient advised to follow-up with [facility] as scheduled to start prenatal care Patient may return to MAU as needed or if her condition were to change or worsen  Vonzella Nipple, PA-C 03/14/2018, 1:39 PM

## 2018-03-14 NOTE — MAU Note (Signed)
Pt states she started having some mild cramping two days ago that has gotten worse.  She then noticed some brown spotting that has moved to pink. She notices it when she wipes. No recent intercourse.

## 2018-03-14 NOTE — Discharge Instructions (Signed)
Hyperemesis Gravidarum °Hyperemesis gravidarum is a severe form of nausea and vomiting that happens during pregnancy. Hyperemesis is worse than morning sickness. It may cause you to have nausea or vomiting all day for many days. It may keep you from eating and drinking enough food and liquids, which can lead to dehydration, malnutrition, and weight loss. Hyperemesis usually occurs during the first half (the first 20 weeks) of pregnancy. It often goes away once a woman is in her second half of pregnancy. However, sometimes hyperemesis continues through an entire pregnancy. °What are the causes? °The cause of this condition is not known. It may be related to changes in chemicals (hormones) in the body during pregnancy, such as the high level of pregnancy hormone (human chorionic gonadotropin) or the increase in the female sex hormone (estrogen). °What are the signs or symptoms? °Symptoms of this condition include: °· Nausea that does not go away. °· Vomiting that does not allow you to keep any food down. °· Weight loss. °· Body fluid loss (dehydration). °· Having no desire to eat, or not liking food that you have previously enjoyed. °How is this diagnosed? °This condition may be diagnosed based on: °· A physical exam. °· Your medical history. °· Your symptoms. °· Blood tests. °· Urine tests. °How is this treated? °This condition is managed by controlling symptoms. This may include: °· Following an eating plan. This can help lessen nausea and vomiting. °· Taking prescription medicines. °An eating plan and medicines are often used together to help control symptoms. If medicines do not help relieve nausea and vomiting, you may need to receive fluids through an IV at the hospital. °Follow these instructions at home: °Eating and drinking ° °· Avoid the following: °? Drinking fluids with meals. Try not to drink anything during the 30 minutes before and after your meals. °? Drinking more than 1 cup of fluid at a  time. °? Eating foods that trigger your symptoms. These may include spicy foods, coffee, high-fat foods, very sweet foods, and acidic foods. °? Skipping meals. Nausea can be more intense on an empty stomach. If you cannot tolerate food, do not force it. Try sucking on ice chips or other frozen items and make up for missed calories later. °? Lying down within 2 hours after eating. °? Being exposed to environmental triggers. These may include food smells, smoky rooms, closed spaces, rooms with strong smells, warm or humid places, overly loud and noisy rooms, and rooms with motion or flickering lights. Try eating meals in a well-ventilated area that is free of strong smells. °? Quick and sudden changes in your movement. °? Taking iron pills and multivitamins that contain iron. If you take prescription iron pills, do not stop taking them unless your health care provider approves. °? Preparing food. The smell of food can spoil your appetite or trigger nausea. °· To help relieve your symptoms: °? Listen to your body. Everyone is different and has different preferences. Find what works best for you. °? Eat and drink slowly. °? Eat 5-6 small meals daily instead of 3 large meals. Eating small meals and snacks can help you avoid an empty stomach. °? In the morning, before getting out of bed, eat a couple of crackers to avoid moving around on an empty stomach. °? Try eating starchy foods as these are usually tolerated well. Examples include cereal, toast, bread, potatoes, pasta, rice, and pretzels. °? Include at least 1 serving of protein with your meals and snacks. Protein options include   lean meats, poultry, seafood, beans, nuts, nut butters, eggs, cheese, and yogurt. ? Try eating a protein-rich snack before bed. Examples of a protein-rick snack include cheese and crackers or a peanut butter sandwich made with 1 slice of whole-wheat bread and 1 tsp (5 g) of peanut butter. ? Eat or suck on things that have ginger in them.  It may help relieve nausea. Add  tsp ground ginger to hot tea or choose ginger tea. ? Try drinking 100% fruit juice or an electrolyte drink. An electrolyte drink contains sodium, potassium, and chloride. ? Drink fluids that are cold, clear, and carbonated or sour. Examples include lemonade, ginger ale, lemon-lime soda, ice water, and sparkling water. ? Brush your teeth or use a mouth rinse after meals. ? Talk with your health care provider about starting a supplement of vitamin B6. General instructions  Take over-the-counter and prescription medicines only as told by your health care provider.  Follow instructions from your health care provider about eating or drinking restrictions.  Continue to take your prenatal vitamins as told by your health care provider. If you are having trouble taking your prenatal vitamins, talk with your health care provider about different options.  Keep all follow-up and pre-birth (prenatal) visits as told by your health care provider. This is important. Contact a health care provider if:  You have pain in your abdomen.  You have a severe headache.  You have vision problems.  You are losing weight.  You feel weak or dizzy. Get help right away if:  You cannot drink fluids without vomiting.  You vomit blood.  You have constant nausea and vomiting.  You are very weak.  You faint.  You have a fever and your symptoms suddenly get worse. Summary  Hyperemesis gravidarum is a severe form of nausea and vomiting that happens during pregnancy.  Making some changes to your eating habits may help relieve nausea and vomiting.  This condition may be managed with medicine.  If medicines do not help relieve nausea and vomiting, you may need to receive fluids through an IV at the hospital. This information is not intended to replace advice given to you by your health care provider. Make sure you discuss any questions you have with your health care  provider. Document Released: 01/21/2005 Document Revised: 02/10/2017 Document Reviewed: 09/20/2015 Elsevier Interactive Patient Education  2019 [employer] Subchorionic Hematoma  A subchorionic hematoma is a gathering of blood between the outer wall of the embryo (chorion) and the inner wall of the womb (uterus). This condition can cause vaginal bleeding. If they cause little or no vaginal bleeding, early small hematomas usually shrink on their own and do not affect your baby or pregnancy. When bleeding starts later in pregnancy, or if the hematoma is larger or occurs in older pregnant women, the condition may be more serious. Larger hematomas may get bigger, which increases the chances of miscarriage. This condition also increases the risk of:  Premature separation of the placenta from the uterus.  Premature (preterm) labor.  Stillbirth. What are the causes? The exact cause of this condition is not known. It occurs when blood is trapped between the placenta and the uterine wall because the placenta has separated from the original site of implantation. What increases the risk? You are more likely to develop this condition if:  You were treated with fertility medicines.  You conceived through in vitro fertilization (IVF). What are the signs or symptoms? Symptoms of this condition include:  Vaginal spotting  or bleeding.  Contractions of the uterus. These cause abdominal pain. Sometimes you may have no symptoms and the bleeding may only be seen when ultrasound images are taken (transvaginal ultrasound). How is this diagnosed? This condition is diagnosed based on a physical exam. This includes a pelvic exam. You may also have other tests, including:  Blood tests.  Urine tests.  Ultrasound of the abdomen. How is this treated? Treatment for this condition can vary. Treatment may include:  Watchful waiting. You will be monitored closely for any changes in bleeding. During this  stage: ? The hematoma may be reabsorbed by the body. ? The hematoma may separate the fluid-filled space containing the embryo (gestational sac) from the wall of the womb (endometrium).  Medicines.  Activity restriction. This may be needed until the bleeding stops. Follow these instructions at home:  Stay on bed rest if told to do so by your health care provider.  Do not lift anything that is heavier than 10 lbs. (4.5 kg) or as told by your health care provider.  Do not use any products that contain nicotine or tobacco, such as cigarettes and e-cigarettes. If you need help quitting, ask your health care provider.  Track and write down the number of pads you use each day and how soaked (saturated) they are.  Do not use tampons.  Keep all follow-up visits as told by your health care provider. This is important. Your health care provider may ask you to have follow-up blood tests or ultrasound tests or both. Contact a health care provider if:  You have any vaginal bleeding.  You have a fever. Get help right away if:  You have severe cramps in your stomach, back, abdomen, or pelvis.  You pass large clots or tissue. Save any tissue for your health care provider to look at.  You have more vaginal bleeding, and you faint or become lightheaded or weak. Summary  A subchorionic hematoma is a gathering of blood between the outer wall of the placenta and the uterus.  This condition can cause vaginal bleeding.  Sometimes you may have no symptoms and the bleeding may only be seen when ultrasound images are taken.  Treatment may include watchful waiting, medicines, or activity restriction. This information is not intended to replace advice given to you by your health care provider. Make sure you discuss any questions you have with your health care provider. Document Released: 05/08/2006 Document Revised: 03/19/2016 Document Reviewed: 03/19/2016 Elsevier Interactive Patient Education  2019  [employer].  Safe Medications in Pregnancy   Acne:  Benzoyl Peroxide  Salicylic Acid   Backache/Headache:  Tylenol: 2 regular strength every 4 hours OR        2 Extra strength every 6 hours   Colds/Coughs/Allergies:  Benadryl (alcohol free) 25 mg every 6 hours as needed  Breath right strips  Claritin  Cepacol throat lozenges  Chloraseptic throat spray  Cold-Eeze- up to three times per day  Cough drops, alcohol free  Flonase (by prescription only)  Guaifenesin  Mucinex  Robitussin DM (plain only, alcohol free)  Saline nasal spray/drops  Sudafed (pseudoephedrine) & Actifed * use only after [redacted] weeks gestation and if you do not have high blood pressure  Tylenol  Vicks Vaporub  Zinc lozenges  Zyrtec   Constipation:  Colace  Ducolax suppositories  Fleet enema  Glycerin suppositories  Metamucil  Milk of magnesia  Miralax  Senokot  Smooth move tea   Diarrhea:  Kaopectate  Imodium A-D   *  NO pepto Bismol   Hemorrhoids:  Anusol  Anusol HC  Preparation H  Tucks   Indigestion:  Tums  Maalox  Mylanta  Zantac  Pepcid   Insomnia:  Benadryl (alcohol free) 25mg  every 6 hours as needed  Tylenol PM  Unisom, no Gelcaps   Leg Cramps:  Tums  MagGel   Nausea/Vomiting:  Bonine  Dramamine  Emetrol  Ginger extract  Sea bands  Meclizine  Nausea medication to take during pregnancy:  Unisom (doxylamine succinate 25 mg tablets) Take one tablet daily at bedtime. If symptoms are not adequately controlled, the dose can be increased to a maximum recommended dose of two tablets daily (1/2 tablet in the morning, 1/2 tablet mid-afternoon and one at bedtime).  Vitamin B6 100mg  tablets. Take one tablet twice a day (up to 200 mg per day).   Skin Rashes:  Aveeno products  Benadryl cream or 25mg  every 6 hours as needed  Calamine Lotion  1% cortisone cream   Yeast infection:  Gyne-lotrimin 7  Monistat 7    **If taking multiple medications, please check labels  to avoid duplicating the same active ingredients  **take medication as directed on the label  ** Do not exceed 4000 mg of tylenol in 24 hours  **Do not take medications that contain aspirin or ibuprofen

## 2018-03-19 ENCOUNTER — Encounter: Payer: Self-pay | Admitting: Obstetrics

## 2018-03-19 ENCOUNTER — Ambulatory Visit (INDEPENDENT_AMBULATORY_CARE_PROVIDER_SITE_OTHER): Payer: Medicaid Other | Admitting: Certified Nurse Midwife

## 2018-03-19 ENCOUNTER — Encounter: Payer: Self-pay | Admitting: Certified Nurse Midwife

## 2018-03-19 ENCOUNTER — Other Ambulatory Visit (HOSPITAL_COMMUNITY)
Admission: RE | Admit: 2018-03-19 | Discharge: 2018-03-19 | Disposition: A | Discharge status: Home / Self Care | Payer: Medicaid Other | Source: Ambulatory Visit | Attending: Certified Nurse Midwife | Admitting: Certified Nurse Midwife

## 2018-03-19 VITALS — BP 111/61 | HR 82 | Wt 172.0 lb

## 2018-03-19 DIAGNOSIS — Z3481 Encounter for supervision of other normal pregnancy, first trimester: Secondary | ICD-10-CM

## 2018-03-19 DIAGNOSIS — O219 Vomiting of pregnancy, unspecified: Secondary | ICD-10-CM

## 2018-03-19 DIAGNOSIS — Z3480 Encounter for supervision of other normal pregnancy, unspecified trimester: Secondary | ICD-10-CM | POA: Insufficient documentation

## 2018-03-19 DIAGNOSIS — Z8619 Personal history of other infectious and parasitic diseases: Secondary | ICD-10-CM

## 2018-03-19 HISTORY — DX: Encounter for supervision of other normal pregnancy, unspecified trimester: Z34.80

## 2018-03-19 MED ORDER — ONDANSETRON 4 MG PO TBDP: 4.0000 mg | ORAL_TABLET | Freq: Three times a day (TID) | ORAL | 2 refills | Status: DC | PRN

## 2018-03-19 MED ORDER — VITAFOL FE+ 90-1-200 & 50 MG PO CPPK: 1.0000 | ORAL_CAPSULE | Freq: Every day | ORAL | 3 refills | Status: DC

## 2018-03-19 NOTE — Progress Notes (Signed)
NOB U/S : 02/14/18 Planned:No  Last pap:07/08/2016 WNL  Genetic Screening: Desires   CC:pt states she has severe nausea and vomiting.  Pt has concerns about genetic screening due to her taking weightloss pills. 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   MAU visit on 03/14/18  FHT's Not heard with doppler.

## 2018-03-19 NOTE — Addendum Note (Signed)
Addended by: Sharyon Cable on: 03/19/2018 01:13 PM   Modules accepted: Orders

## 2018-03-19 NOTE — Progress Notes (Signed)
Subjective:    [name] is a 28 y.o. [id number] at [redacted]w[redacted]d by early ultrasound being seen today for her first obstetrical visit.  Her obstetrical history is significant for nothing. Patient does intend to breast feed. Pregnancy history fully reviewed.  Patient reports nausea and vomiting.    Objective  HISTORY: OB History  Gravida Para Term Preterm AB Living  7 4 4  0 2 4  SAB TAB Ectopic Multiple Live Births  0 1 1 0 4    # Outcome Date GA Lbr Len/2nd Weight Sex Delivery Anes PTL Lv  7 Current           6 Term 2018     Vag-Spont   LIV  5 Term 02/15/15 [redacted]w[redacted]d   F Vag-Spont   LIV  4 Ectopic 02/08/14          3 Term 03/03/12 [redacted]w[redacted]d 01:10 / 00:07 7 lb 2 oz (3.232 kg) F Vag-Spont Local  LIV     Birth Comments: wnl     Name: LAMEKIA, LEPPEK     Apgar1: 9  Apgar5: 9  2 Term 04/27/08 [redacted]w[redacted]d   M Vag-Spont   LIV  1 TAB 04/05/06 [redacted]w[redacted]d           Last pap smear was done 2018 and was normal  Past Medical History:  Diagnosis Date  . Anemia    Past Surgical History:  Procedure Laterality Date  . NO PAST SURGERIES     Family History  Problem Relation Age of Onset  . Diabetes Mother   . Hyperlipidemia Mother   . Hypertension Mother    Social History   Tobacco Use  . Smoking status: Never Smoker  . Smokeless tobacco: Never Used  Substance Use Topics  . Alcohol use: No  . Drug use: No   Allergies  Allergen Reactions  . Pollen Extract Other (See Comments)    Migraines   Current Outpatient Medications on File Prior to Visit  Medication Sig Dispense Refill  . acetaminophen (TYLENOL) 500 MG tablet Take 1,000 mg by mouth every 6 (six) hours as needed for mild pain.    . Elastic Bandages & Supports (COMFORT FIT MATERNITY SUPP LG) MISC 1 Units daily by Does not apply route. (Patient not taking: Reported on 03/19/2018) 1 each 0  . Prenat-FeAsp-Meth-FA-DHA w/o A (PRENATE PIXIE) 10-0.6-0.4-200 MG CAPS Take 1 tablet by mouth daily. (Patient not taking: Reported on 03/19/2018) 30 capsule  12  . promethazine (PHENERGAN) 12.5 MG tablet Take 1 tablet (12.5 mg total) by mouth every 6 (six) hours as needed for nausea or vomiting. (Patient not taking: Reported on 03/19/2018) 30 tablet 0  . Vitamin D, Ergocalciferol, (DRISDOL) 50000 units CAPS capsule Take 1 capsule (50,000 Units total) by mouth every 7 (seven) days. (Patient not taking: Reported on 03/19/2018) 30 capsule 2   No current facility-administered medications on file prior to visit.     Review of Systems Pertinent items noted in HPI and remainder of comprehensive ROS otherwise negative.  Exam   Vitals:   03/19/18 1103  BP: 111/61  Pulse: 82  Weight: 172 lb (78 kg)   Fetal Heart Rate (bpm): 140  Pelvic Exam: Perineum: no hemorrhoids, normal perineum   Vulva: normal external genitalia, no lesions   Vagina:  normal mucosa, normal discharge   Cervix: no lesions and normal, pap smear done.    Adnexa: normal adnexa and no mass, fullness, tenderness   Bony Pelvis: average  System: General: well-developed,  well-nourished female in no acute distress   Skin: normal coloration and turgor, no rashes   Neurologic: oriented, normal, negative, normal mood   Extremities: normal strength, tone, and muscle mass, ROM of all joints is normal   HEENT PERRLA, extraocular movement intact and sclera clear   Mouth/Teeth mucous membranes moist, pharynx normal without lesions and dental hygiene good   Neck supple and no masses   Cardiovascular: regular rate and rhythm   Respiratory:  no respiratory distress, normal breath sounds   Abdomen: soft, non-tender; bowel sounds normal; no masses,  no organomegaly   Bedside Ultrasound for FHR check: Patient informed that the ultrasound is considered a limited obstetric ultrasound and is not intended to be a complete ultrasound exam.  Patient also informed that the ultrasound is not being completed with the intent of assessing for fetal or placental anomalies or any pelvic abnormalities.   Explained that the purpose of today's ultrasound is to assess for fetal heart rate.  Patient acknowledges the purpose of the exam and the limitations of the study.    FHR 140 by bedside US   Assessment:    Pregnancy: [id number] Patient Active Problem List   Diagnosis Date Noted  . Supervision of other normal pregnancy, antepartum 03/19/2018  . Indication for care in labor or delivery 01/25/2017  . Positive GBS test 01/04/2017  . History of herpes genitalis 12/31/2016  . Low vitamin D level 07/10/2016     Plan:    1. Supervision of other normal pregnancy, antepartum - Routine prenatal care  - Anticipatory guidance on upcoming appointments - Patient concerned with possible cyst on fetus neck as this occurred with previous pregnancy, educated and discussed genetic screening- patient opted into all genetic screening  - Obstetric Panel, Including HIV - Genetic Screening - Hemoglobinopathy evaluation - Cystic Fibrosis Mutation 97 - SMN1 COPY NUMBER ANALYSIS (SMA Carrier Screen) - Culture, OB Urine - Cytology - PAP( [place]) - Enroll Patient in Babyscripts - US Fetal Nuchal Translucency Measurement; Future  2. Nausea and vomiting during pregnancy - Reports continued nausea and once occurrence of vomiting per day  - Has not taken any medication for N/V during this pregnancy, was previously on Diclegis in first trimester with last pregnancies  - ondansetron (ZOFRAN ODT) 4 MG disintegrating tablet; Take 1 tablet (4 mg total) by mouth every 8 (eight) hours as needed for nausea or vomiting.  Dispense: 30 tablet; Refill: 2  3. History of herpes genitalis - Patient reports herpes is orally, has not had recent outbreak  - Suppression started at 36 weeks    Initial labs drawn. Continue prenatal vitamins. Genetic Screening discussed, First trimester screen and NIPS: ordered. Ultrasound discussed; fetal anatomic survey: requested. Problem list reviewed and updated. The nature of [facility] with multiple MDs and other Advanced Practice Providers was explained to patient; also emphasized that residents, students are part of our team. Routine obstetric precautions reviewed. Return in about 4 weeks (around 04/16/2018) for ROB.      Sharyon Cable, CNM [facility] [employer]

## 2018-03-19 NOTE — Patient Instructions (Signed)

## 2018-03-20 LAB — CYTOLOGY - PAP
Chlamydia: NEGATIVE
Diagnosis: NEGATIVE
Neisseria Gonorrhea: NEGATIVE
Trichomonas: NEGATIVE

## 2018-03-21 LAB — URINE CULTURE, OB REFLEX

## 2018-03-21 LAB — CULTURE, OB URINE

## 2018-03-26 ENCOUNTER — Ambulatory Visit (HOSPITAL_COMMUNITY): Payer: Medicaid Other

## 2018-03-26 ENCOUNTER — Other Ambulatory Visit (HOSPITAL_COMMUNITY): Payer: Self-pay

## 2018-03-30 LAB — HEMOGLOBINOPATHY EVALUATION
HGB C: 0 %
HGB S: 0 %
HGB VARIANT: 0 %
Hemoglobin A2 Quantitation: 2.2 % (ref 1.8–3.2)
Hemoglobin F Quantitation: 0 % (ref 0.0–2.0)
Hgb A: 97.8 % (ref 96.4–98.8)

## 2018-03-30 LAB — SMN1 COPY NUMBER ANALYSIS (SMA CARRIER SCREENING)

## 2018-03-30 LAB — OBSTETRIC PANEL, INCLUDING HIV
Antibody Screen: NEGATIVE
Basophils Absolute: 0.1 10*3/uL (ref 0.0–0.2)
Basos: 0 %
EOS (ABSOLUTE): 0.2 10*3/uL (ref 0.0–0.4)
Eos: 2 %
HIV Screen 4th Generation wRfx: NONREACTIVE
Hematocrit: 34.6 % (ref 34.0–46.6)
Hemoglobin: 11.7 g/dL (ref 11.1–15.9)
Hepatitis B Surface Ag: NEGATIVE
Immature Grans (Abs): 0 10*3/uL (ref 0.0–0.1)
Immature Granulocytes: 0 %
Lymphocytes Absolute: 1.9 10*3/uL (ref 0.7–3.1)
Lymphs: 17 %
MCH: 26.3 pg — ABNORMAL LOW (ref 26.6–33.0)
MCHC: 33.8 g/dL (ref 31.5–35.7)
MCV: 78 fL — ABNORMAL LOW (ref 79–97)
Monocytes Absolute: 0.7 10*3/uL (ref 0.1–0.9)
Monocytes: 7 %
Neutrophils Absolute: 8.2 10*3/uL — ABNORMAL HIGH (ref 1.4–7.0)
Neutrophils: 74 %
Platelets: 329 10*3/uL (ref 150–450)
RBC: 4.45 x10E6/uL (ref 3.77–5.28)
RDW: 13 % (ref 11.7–15.4)
RPR Ser Ql: NONREACTIVE
Rh Factor: POSITIVE
Rubella Antibodies, IGG: 6.27 index (ref >0.99)
WBC: 11.2 10*3/uL — ABNORMAL HIGH (ref 3.4–10.8)

## 2018-03-30 LAB — CYSTIC FIBROSIS MUTATION 97: Interpretation: NOT DETECTED

## 2018-04-01 ENCOUNTER — Telehealth: Payer: Self-pay

## 2018-04-01 NOTE — Telephone Encounter (Signed)
  Left VM message that I was returning her call and she can  call the Office.

## 2018-04-07 ENCOUNTER — Ambulatory Visit (HOSPITAL_COMMUNITY): Payer: Medicaid Other

## 2018-04-07 ENCOUNTER — Telehealth: Payer: Self-pay

## 2018-04-07 ENCOUNTER — Encounter (HOSPITAL_COMMUNITY): Payer: Self-pay

## 2018-04-07 ENCOUNTER — Other Ambulatory Visit (HOSPITAL_COMMUNITY): Payer: Self-pay

## 2018-04-07 ENCOUNTER — Ambulatory Visit (HOSPITAL_COMMUNITY)
Admission: RE | Admit: 2018-04-07 | Discharge: 2018-04-07 | Disposition: A | Discharge status: Home / Self Care | Payer: Medicaid Other | Source: Ambulatory Visit | Attending: Certified Nurse Midwife | Admitting: Certified Nurse Midwife

## 2018-04-07 DIAGNOSIS — O3680X1 Pregnancy with inconclusive fetal viability, fetus 1: Secondary | ICD-10-CM

## 2018-04-07 DIAGNOSIS — Z3480 Encounter for supervision of other normal pregnancy, unspecified trimester: Secondary | ICD-10-CM

## 2018-04-07 NOTE — Telephone Encounter (Signed)
Return call to pt regarding message Pt wants explanation  about not getting U/S today.  Pt not ava left detailed message for pt to return call

## 2018-04-09 ENCOUNTER — Encounter: Payer: Self-pay | Admitting: Advanced Practice Midwife

## 2018-04-09 DIAGNOSIS — Z8279 Family history of other congenital malformations, deformations and chromosomal abnormalities: Secondary | ICD-10-CM | POA: Insufficient documentation

## 2018-04-15 ENCOUNTER — Ambulatory Visit (HOSPITAL_COMMUNITY): Payer: Medicaid Other | Admitting: *Deleted

## 2018-04-15 ENCOUNTER — Ambulatory Visit (HOSPITAL_COMMUNITY)
Admission: RE | Admit: 2018-04-15 | Discharge: 2018-04-15 | Disposition: A | Discharge status: Home / Self Care | Payer: Medicaid Other | Source: Ambulatory Visit | Attending: Obstetrics and Gynecology | Admitting: Obstetrics and Gynecology

## 2018-04-15 ENCOUNTER — Other Ambulatory Visit (HOSPITAL_COMMUNITY): Payer: Self-pay | Admitting: *Deleted

## 2018-04-15 ENCOUNTER — Encounter: Payer: Medicaid Other | Admitting: Certified Nurse Midwife

## 2018-04-15 ENCOUNTER — Other Ambulatory Visit: Payer: Self-pay

## 2018-04-15 ENCOUNTER — Encounter (HOSPITAL_COMMUNITY): Payer: Self-pay

## 2018-04-15 VITALS — BP 112/60 | HR 79 | Wt 175.6 lb

## 2018-04-15 DIAGNOSIS — Z87798 Personal history of other (corrected) congenital malformations: Secondary | ICD-10-CM

## 2018-04-15 DIAGNOSIS — Z3A14 14 weeks gestation of pregnancy: Secondary | ICD-10-CM

## 2018-04-15 DIAGNOSIS — O359XX0 Maternal care for (suspected) fetal abnormality and damage, unspecified, not applicable or unspecified: Secondary | ICD-10-CM | POA: Diagnosis present

## 2018-04-15 DIAGNOSIS — O358XX0 Maternal care for other (suspected) fetal abnormality and damage, not applicable or unspecified: Secondary | ICD-10-CM

## 2018-04-15 DIAGNOSIS — O4592 Premature separation of placenta, unspecified, second trimester: Secondary | ICD-10-CM

## 2018-04-15 DIAGNOSIS — O3680X1 Pregnancy with inconclusive fetal viability, fetus 1: Secondary | ICD-10-CM | POA: Diagnosis not present

## 2018-04-15 DIAGNOSIS — O3680X0 Pregnancy with inconclusive fetal viability, not applicable or unspecified: Secondary | ICD-10-CM

## 2018-04-15 NOTE — Progress Notes (Signed)
Us m 

## 2018-04-16 ENCOUNTER — Other Ambulatory Visit: Payer: Self-pay | Admitting: Certified Nurse Midwife

## 2018-04-16 ENCOUNTER — Telehealth: Payer: Self-pay

## 2018-04-16 DIAGNOSIS — O219 Vomiting of pregnancy, unspecified: Secondary | ICD-10-CM

## 2018-04-16 MED ORDER — DICLEGIS 10-10 MG PO TBEC: 2.0000 | DELAYED_RELEASE_TABLET | Freq: Every day | ORAL | 2 refills | Status: DC

## 2018-04-16 NOTE — Telephone Encounter (Signed)
Patient called and states that the zofran you prescribed her is not helping with her N/V, would like to know if she can get diclegis prescribed like she took in her previous pregnancies.

## 2018-04-23 ENCOUNTER — Ambulatory Visit (INDEPENDENT_AMBULATORY_CARE_PROVIDER_SITE_OTHER): Payer: Medicaid Other | Admitting: Obstetrics and Gynecology

## 2018-04-23 ENCOUNTER — Encounter: Payer: Self-pay | Admitting: Obstetrics and Gynecology

## 2018-04-23 ENCOUNTER — Other Ambulatory Visit: Payer: Self-pay

## 2018-04-23 VITALS — BP 107/73 | HR 93 | Wt 177.7 lb

## 2018-04-23 DIAGNOSIS — Z3A15 15 weeks gestation of pregnancy: Secondary | ICD-10-CM

## 2018-04-23 DIAGNOSIS — Z3480 Encounter for supervision of other normal pregnancy, unspecified trimester: Secondary | ICD-10-CM

## 2018-04-23 DIAGNOSIS — Z3482 Encounter for supervision of other normal pregnancy, second trimester: Secondary | ICD-10-CM

## 2018-04-23 NOTE — Progress Notes (Signed)
   PRENATAL VISIT NOTE  Subjective:  [name] is a 28 y.o. [id number] at [redacted]w[redacted]d being seen today for ongoing prenatal care.  She is currently monitored for the following issues for this low-risk pregnancy and has Low vitamin D level; History of herpes genitalis; Positive GBS test; Indication for care in labor or delivery; Supervision of other normal pregnancy, antepartum; and Family history of Turner syndrome on their problem list.  Patient reports no complaints.  Contractions: Not present. Vag. Bleeding: None.  Movement: Present. Denies leaking of fluid.   The following portions of the patient's history were reviewed and updated as appropriate: allergies, current medications, past family history, past medical history, past social history, past surgical history and problem list.   Objective:   Vitals:   04/23/18 1522  BP: 107/73  Pulse: 93  Weight: 177 lb 11.2 oz (80.6 kg)    Fetal Status: Fetal Heart Rate (bpm): 156   Movement: Present     General:  Alert, oriented and cooperative. Patient is in no acute distress.  Skin: Skin is warm and dry. No rash noted.   Cardiovascular: Normal heart rate noted  Respiratory: Normal respiratory effort, no problems with respiration noted  Abdomen: Soft, gravid, appropriate for gestational age.  Pain/Pressure: Absent     Pelvic: Cervical exam deferred        Extremities: Normal range of motion.  Edema: None  Mental Status: Normal mood and affect. Normal behavior. Normal judgment and thought content.   Assessment and Plan:  Pregnancy: [id number] at [redacted]w[redacted]d 1. Supervision of other normal pregnancy, antepartum Patient is doing well without complaints AFP today Scheduled for anatomy ultrasound in April - AFP, Serum, Open Spina Bifida  Preterm labor symptoms and general obstetric precautions including but not limited to vaginal bleeding, contractions, leaking of fluid and fetal movement were reviewed in detail with the patient. Please refer to After  Visit Summary for other counseling recommendations.   Return in about 4 weeks (around 05/21/2018) for ROB.  Future Appointments  Date Time Provider Department Center  05/20/2018  7:50 AM [facility] NURSE [facility]  05/20/2018  8:00 AM [facility] [place] 3 [facility]    Catalina Antigua, MD

## 2018-04-23 NOTE — Progress Notes (Signed)
ROB/AFP 

## 2018-04-29 LAB — AFP, SERUM, OPEN SPINA BIFIDA
AFP MoM: 2.13
AFP Value: 66 ng/mL
GEST. AGE ON COLLECTION DATE: 15.6 wk
Maternal Age At EDD: 29.3 yr
OSBR Risk 1 IN: 1213
Test Results:: NEGATIVE
Weight: 177 [lb_av]

## 2018-05-01 ENCOUNTER — Other Ambulatory Visit: Payer: Self-pay

## 2018-05-01 DIAGNOSIS — Z3480 Encounter for supervision of other normal pregnancy, unspecified trimester: Secondary | ICD-10-CM

## 2018-05-01 MED ORDER — VITAFOL GUMMIES 3.33-0.333-34.8 MG PO CHEW: 3.0000 | CHEWABLE_TABLET | ORAL | 11 refills | Status: AC

## 2018-05-20 ENCOUNTER — Ambulatory Visit (HOSPITAL_COMMUNITY): Payer: Medicaid Other | Admitting: *Deleted

## 2018-05-20 ENCOUNTER — Encounter (HOSPITAL_COMMUNITY): Payer: Self-pay

## 2018-05-20 ENCOUNTER — Ambulatory Visit (HOSPITAL_BASED_OUTPATIENT_CLINIC_OR_DEPARTMENT_OTHER)
Admission: RE | Admit: 2018-05-20 | Discharge: 2018-05-20 | Disposition: A | Discharge status: Home / Self Care | Payer: Medicaid Other | Source: Ambulatory Visit

## 2018-05-20 ENCOUNTER — Other Ambulatory Visit: Payer: Self-pay

## 2018-05-20 ENCOUNTER — Other Ambulatory Visit (HOSPITAL_COMMUNITY): Payer: Self-pay | Admitting: Maternal & Fetal Medicine

## 2018-05-20 ENCOUNTER — Ambulatory Visit (HOSPITAL_COMMUNITY)
Admission: RE | Admit: 2018-05-20 | Discharge: 2018-05-20 | Disposition: A | Discharge status: Home / Self Care | Payer: Medicaid Other | Source: Ambulatory Visit | Attending: Obstetrics and Gynecology | Admitting: Obstetrics and Gynecology

## 2018-05-20 VITALS — Temp 98.6°F

## 2018-05-20 DIAGNOSIS — Z3A19 19 weeks gestation of pregnancy: Secondary | ICD-10-CM | POA: Diagnosis not present

## 2018-05-20 DIAGNOSIS — O358XX0 Maternal care for other (suspected) fetal abnormality and damage, not applicable or unspecified: Secondary | ICD-10-CM | POA: Diagnosis present

## 2018-05-20 DIAGNOSIS — Z8279 Family history of other congenital malformations, deformations and chromosomal abnormalities: Secondary | ICD-10-CM | POA: Diagnosis present

## 2018-05-20 DIAGNOSIS — Z87798 Personal history of other (corrected) congenital malformations: Secondary | ICD-10-CM | POA: Diagnosis not present

## 2018-05-20 DIAGNOSIS — O0990 Supervision of high risk pregnancy, unspecified, unspecified trimester: Secondary | ICD-10-CM | POA: Diagnosis present

## 2018-05-25 ENCOUNTER — Encounter: Payer: Self-pay | Admitting: Obstetrics and Gynecology

## 2018-05-25 ENCOUNTER — Other Ambulatory Visit: Payer: Self-pay

## 2018-05-25 ENCOUNTER — Ambulatory Visit (INDEPENDENT_AMBULATORY_CARE_PROVIDER_SITE_OTHER): Payer: Medicaid Other | Admitting: Obstetrics and Gynecology

## 2018-05-25 DIAGNOSIS — Z3480 Encounter for supervision of other normal pregnancy, unspecified trimester: Secondary | ICD-10-CM

## 2018-05-25 DIAGNOSIS — Z8619 Personal history of other infectious and parasitic diseases: Secondary | ICD-10-CM

## 2018-05-25 DIAGNOSIS — Z3A20 20 weeks gestation of pregnancy: Secondary | ICD-10-CM

## 2018-05-25 DIAGNOSIS — Z3482 Encounter for supervision of other normal pregnancy, second trimester: Secondary | ICD-10-CM | POA: Diagnosis not present

## 2018-05-25 MED ORDER — BLOOD PRESSURE CUFF MISC: 1.0000 | 0 refills | Status: DC

## 2018-05-25 NOTE — Progress Notes (Signed)
Pt is on the phone for televisit. [redacted]w[redacted]d Pt has anatomy scan [facility]. Pt signed up for babyscripts and advised she will be receiving an email and a kit in the mail

## 2018-05-25 NOTE — Progress Notes (Signed)
   TELEHEALTH VIRTUAL OBSTETRICS VISIT ENCOUNTER NOTE  I connected with [name] on 05/25/18 at 10:30 AM EDT by telephone at home and verified that I am speaking with the correct person using two identifiers.   I discussed the limitations, risks, security and privacy concerns of performing an evaluation and management service by telephone and the availability of in person appointments. I also discussed with the patient that there may be a patient responsible charge related to this service. The patient expressed understanding and agreed to proceed.  Subjective:  [name] is a 28 y.o. [id number] at [redacted]w[redacted]d being followed for ongoing prenatal care.  She is currently monitored for the following issues for this low-risk pregnancy and has History of herpes genitalis; Supervision of other normal pregnancy, antepartum; and Family history of Turner syndrome on their problem list.  Patient reports no complaints. Reports fetal movement. Denies any contractions, bleeding or leaking of fluid.   The following portions of the patient's history were reviewed and updated as appropriate: allergies, current medications, past family history, past medical history, past social history, past surgical history and problem list.   Objective:   General:  Alert, oriented and cooperative.   Mental Status: Normal mood and affect perceived. Normal judgment and thought content.  Rest of physical exam deferred due to type of encounter  Assessment and Plan:  Pregnancy: [id number] at [redacted]w[redacted]d 1. Supervision of other normal pregnancy, antepartum Patient is doing well Rx blood pressure cuff was provided Patient scheduled for anatomy ultrasound tomorrow Patient understands that her next prenatal visit may be another telehealth visit  2. History of herpes genitalis Prophylaxis will be provided   Preterm labor symptoms and general obstetric precautions including but not limited to vaginal bleeding, contractions, leaking of fluid  and fetal movement were reviewed in detail with the patient.  I discussed the assessment and treatment plan with the patient. The patient was provided an opportunity to ask questions and all were answered. The patient agreed with the plan and demonstrated an understanding of the instructions. The patient was advised to call back or seek an in-person office evaluation/go to MAU at [facility] for any urgent or concerning symptoms. Please refer to After Visit Summary for other counseling recommendations.   I provided 15 minutes of non-face-to-face time during this encounter.  Return in about 4 weeks (around 06/22/2018), or Telehealth , for ROB.  Future Appointments  Date Time Provider Department Center  05/25/2018 10:30 AM Cing , Gigi Gin, MD [facility] None  05/26/2018  1:30 PM [facility] NURSE [facility]  05/26/2018  1:30 PM [facility] [place] 1 [facility]    Catalina Antigua, MD Center for [employer], [facility]

## 2018-05-26 ENCOUNTER — Ambulatory Visit (HOSPITAL_COMMUNITY): Payer: Medicaid Other | Admitting: *Deleted

## 2018-05-26 ENCOUNTER — Encounter (HOSPITAL_COMMUNITY): Payer: Self-pay | Admitting: *Deleted

## 2018-05-26 ENCOUNTER — Ambulatory Visit (HOSPITAL_COMMUNITY)
Admission: RE | Admit: 2018-05-26 | Discharge: 2018-05-26 | Disposition: A | Discharge status: Home / Self Care | Payer: Medicaid Other | Source: Ambulatory Visit | Attending: Obstetrics and Gynecology | Admitting: Obstetrics and Gynecology

## 2018-05-26 ENCOUNTER — Other Ambulatory Visit: Payer: Self-pay

## 2018-05-26 DIAGNOSIS — Z3A20 20 weeks gestation of pregnancy: Secondary | ICD-10-CM

## 2018-05-26 DIAGNOSIS — O358XX0 Maternal care for other (suspected) fetal abnormality and damage, not applicable or unspecified: Secondary | ICD-10-CM | POA: Insufficient documentation

## 2018-05-26 DIAGNOSIS — Z87798 Personal history of other (corrected) congenital malformations: Secondary | ICD-10-CM | POA: Diagnosis not present

## 2018-05-26 DIAGNOSIS — Z3480 Encounter for supervision of other normal pregnancy, unspecified trimester: Secondary | ICD-10-CM | POA: Diagnosis present

## 2018-06-02 ENCOUNTER — Other Ambulatory Visit: Payer: Self-pay | Admitting: Obstetrics and Gynecology

## 2018-06-02 ENCOUNTER — Telehealth: Payer: Self-pay | Admitting: *Deleted

## 2018-06-02 DIAGNOSIS — O26899 Other specified pregnancy related conditions, unspecified trimester: Secondary | ICD-10-CM

## 2018-06-02 DIAGNOSIS — R102 Pelvic and perineal pain: Principal | ICD-10-CM

## 2018-06-02 NOTE — Telephone Encounter (Signed)
Pt made aware of recommendations. 

## 2018-06-02 NOTE — Telephone Encounter (Signed)
Pt called to office to inquire about physical therapy referral.  Pt states she is having significant pelvic pain and would like help with ADL's during pregnancy.  Pt states it has become harder for her do preform daily activities.   Please advise, ?enter referral or pt need appt to discuss.

## 2018-06-08 ENCOUNTER — Encounter: Payer: Self-pay | Admitting: Physical Therapy

## 2018-06-08 ENCOUNTER — Ambulatory Visit: Payer: Medicaid Other | Attending: Obstetrics and Gynecology | Admitting: Physical Therapy

## 2018-06-08 ENCOUNTER — Other Ambulatory Visit: Payer: Self-pay

## 2018-06-08 DIAGNOSIS — M62830 Muscle spasm of back: Secondary | ICD-10-CM | POA: Diagnosis present

## 2018-06-08 DIAGNOSIS — M6281 Muscle weakness (generalized): Secondary | ICD-10-CM | POA: Insufficient documentation

## 2018-06-08 DIAGNOSIS — R279 Unspecified lack of coordination: Secondary | ICD-10-CM

## 2018-06-08 NOTE — Patient Instructions (Signed)
Access Code: [email or web address]  URL: [email or web address]  Date: 06/08/2018  Prepared by: Dwana Curd   Exercises  Ball squeeze with Kegel - 10 reps - 1 sets - 3 sec hold - 3x daily - 7x weekly  Supine Posterior Pelvic Tilt - 10 reps - 3 sets - 1x daily - 7x weekly  Hooklying Transversus Abdominis Palpation - 10 reps - 3 sets - 1x daily - 7x weekly  Seated Hamstring Stretch - 10 reps - 3 sets - 1x daily - 7x weekly  Patient Education  Office Posture

## 2018-06-08 NOTE — Therapy (Addendum)
[facility] [address] [place], [place], [address] Phone: [phone]   Fax:  [phone]  Physical Therapy Evaluation  Patient Details  Name: [name] MRN: [id number] Date of Birth: 1990-02-26 Referring Provider (PT): Catalina Antigua, [name]   Encounter Date: 06/08/2018  PT End of Session - 06/08/18 1615    Visit Number  1    Date for PT Re-Evaluation  08/31/18    PT Start Time  1503    PT Stop Time  1548    PT Time Calculation (min)  45 min    Activity Tolerance  Patient tolerated treatment well    Behavior During Therapy  [facility] for tasks assessed/performed       Past Medical History:  Diagnosis Date  . Anemia   . Family history of Turner syndrome 2008   w/ first pregnancy    Past Surgical History:  Procedure Laterality Date  . NO PAST SURGERIES      There were no vitals filed for this visit.   Subjective Assessment - 06/08/18 1509    Subjective  Went to work and it got up to 8/10    Limitations  Sitting    How long can you sit comfortably?  10 minutes    Currently in Pain?  Yes    Pain Score  5     Pain Location  Back    Pain Orientation  Mid;Lower    Pain Descriptors / Indicators  Pressure    Pain Type  Acute pain    Pain Onset  1 to 4 weeks ago    Pain Frequency  Intermittent    Aggravating Factors   turning or sitting >10 min getting in and out of car    Pain Relieving Factors  lying on side    Multiple Pain Sites  No         [facility] PT Assessment - 06/08/18 0001      Assessment   Medical Diagnosis  O26.899,R10.2 (ICD-10-CM) - Pelvic pain in pregnancy    Referring Provider (PT)  Constant, Peggy, [name]    Onset Date/Surgical Date  --   3 weeks ago   Prior Therapy  No      Precautions   Precautions  None      Restrictions   Weight Bearing Restrictions  No      Balance Screen   Has the patient fallen in the past 6 months  No      Home Environment   Living Environment  Private  residence    Living Arrangements  Spouse/significant other;Children   3 children and partner     Prior Function   Level of Independence  Independent    Vocation  Full time employment    Vocation Requirements  standing mostly;       Cognition   Overall Cognitive Status  Within Functional Limits for tasks assessed      Posture/Postural Control   Posture/Postural Control  Postural limitations    Postural Limitations  Increased lumbar lordosis;Anterior pelvic tilt;Rounded Shoulders      ROM / Strength   AROM / PROM / Strength  Strength;AROM      AROM   Overall AROM Comments  no lumbar flexion with forward flexion      Strength   Overall Strength  --   diastasis rectus   Overall Strength Comments  hip adduction 3/5 bilat; hip abduction 4+/5 bilat      Flexibility   Soft  Tissue Assessment /Muscle Length  yes   WNL     Palpation   Palpation comment  sacral distraction produced decreased lumbar symptoms      Transfers   Transfers  Sit to Stand    Comments  increased trunk flexion and push on knees      Ambulation/Gait   Gait Pattern  Within Functional Limits                Objective measurements completed on examination: See above findings.    Pelvic Floor Special Questions - 06/08/18 0001    Prior Pelvic/Prostate Exam  Yes    Are you Pregnant or attempting pregnancy?  Yes    Prior Pregnancies  Yes    Number of Pregnancies  4    Number of Vaginal Deliveries  4    Urinary Leakage  Yes    How often  not often    Activities that cause leaking  Sneezing    Falling out feeling (prolapse)  No    External Perineal Exam  deferred    Exam Type  Deferred       [facility] Adult PT Treatment/Exercise - 06/08/18 0001      Self-Care   Self-Care  Other Self-Care Comments    Other Self-Care Comments   HEP and posture in sitting             PT Education - 06/08/18 1615    Education Details   Access Code: [email or web address] and sitting posture    Person(s) Educated   Patient    Methods  Explanation;Demonstration;Handout    Comprehension  Verbalized understanding;Returned demonstration          PT Long Term Goals - 06/08/18 1643      PT LONG TERM GOAL #1   Title  pt will report 50% less pain with rolling in bed    Baseline  8/10    Time  12    Period  Weeks    Status  New    Target Date  08/31/18      PT LONG TERM GOAL #2   Title  Pt will be ind with HEP    Baseline  does not know    Time  12    Period  Weeks    Status  New    Target Date  08/31/18      PT LONG TERM GOAL #3   Title  Pt will be able to perform her job duties with standing for 12 hour shift without increased pain    Baseline  8/10    Time  12    Period  Weeks    Status  New    Target Date  08/31/18      PT LONG TERM GOAL #4   Title  Pt will be able to coordinate core and pelvic floor muscles so she can get in and out of the car without increased pain    Baseline  8/10    Time  12    Period  Weeks    Status  New    Target Date  08/31/18             Plan - 06/08/18 1630    Clinical Impression Statement  Patient is [redacted] weeks pregnant upon entering clinic today.  She has been having pubic and low back pain for several weeks now.  She states it is always sore and gets worse with transfers, rolling in bed, sitting >10 minutes and  throughout her shift at work.  Pt has weakness of abdominals with a diastasis that she has had since her last pregnancy.  Pt has some leakage with sneezing but not frequent .  She has hip weakness as mentioned above.  She has no lumbar flexion with forwrad flex.  She Pt will benefit from skilled PT to address impairments and strengthen core for improved function during work and with ADLs.    Personal Factors and Comorbidities  Other;Profession;Comorbidity 2    Comorbidities  multiple births, rectus abdominus diastasis    Examination-Activity Limitations  Bed Mobility;Squat;Sit;Transfers;Stairs;Continence    Examination-Participation  Restrictions  Other;Driving    Stability/Clinical Decision Making  Evolving/Moderate complexity    Clinical Decision Making  Moderate    PT Frequency  1x / week (3x for first week)   PT Duration  12 weeks    PT Treatment/Interventions  ADLs/Self Care Home Management;Biofeedback;Cryotherapy;Electrical Stimulation;Moist Heat;Therapeutic activities;Therapeutic exercise;Neuromuscular re-education;Manual techniques;Passive range of motion;Taping    PT Next Visit Plan  progress gentle core strength for decreased diastasis, transfer techniques, bed mobility    PT Home Exercise Plan   Access Code: [email or web address]     Consulted and Agree with Plan of Care  Patient       Patient will benefit from skilled therapeutic intervention in order to improve the following deficits and impairments:  Difficulty walking, Increased muscle spasms, Pain, Postural dysfunction, Impaired flexibility, Increased fascial restricitons, Decreased strength, Decreased range of motion  Visit Diagnosis: Muscle weakness (generalized)  Muscle spasm of back  Unspecified lack of coordination     Problem List Patient Active Problem List   Diagnosis Date Noted  . Family history of Turner syndrome 04/09/2018  . Supervision of other normal pregnancy, antepartum 03/19/2018  . History of herpes genitalis 12/31/2016    Junious SilkJakki L Mariama Saintvil, PT 06/08/2018, 4:46 PM  [facility] [address] [place], [place], [address] Phone: [phone]   Fax:  [phone]  Name: [name] MRN: [id number] Date of Birth: 1990/05/25

## 2018-06-10 NOTE — Addendum Note (Signed)
Addended by: Beatris Si on: 06/10/2018 01:08 PM   Modules accepted: Orders

## 2018-06-22 ENCOUNTER — Other Ambulatory Visit: Payer: Self-pay

## 2018-06-22 ENCOUNTER — Encounter: Payer: Self-pay | Admitting: Physical Therapy

## 2018-06-22 ENCOUNTER — Telehealth: Payer: Medicaid Other | Admitting: Obstetrics & Gynecology

## 2018-06-22 ENCOUNTER — Telehealth: Payer: Self-pay | Admitting: *Deleted

## 2018-06-22 ENCOUNTER — Ambulatory Visit: Payer: Medicaid Other | Admitting: Physical Therapy

## 2018-06-22 DIAGNOSIS — M6281 Muscle weakness (generalized): Secondary | ICD-10-CM | POA: Diagnosis not present

## 2018-06-22 DIAGNOSIS — M62830 Muscle spasm of back: Secondary | ICD-10-CM

## 2018-06-22 DIAGNOSIS — R279 Unspecified lack of coordination: Secondary | ICD-10-CM

## 2018-06-22 NOTE — Telephone Encounter (Signed)
Pt called to office to question back pain she had. Pt states only lasted about 10 min but was very painful.  Return call to pt.  Pt made aware may be some normal back pain in pregnancy, position of baby and/or pressure on nerves.  Pt advised to discuss at PT appt today and discuss with provider this week. Pt advised she may want to use back support belt and try stretches to relieve back pain.

## 2018-06-22 NOTE — Therapy (Addendum)
[facility] [address], [place], [place], [address] Phone: [phone]   Fax:  [phone]  Physical Therapy Treatment  Patient Details  Name: Janita Camberos MRN: [id number] Date of Birth: [birth year] Referring Provider (PT): Mora Bellman, MD   Encounter Date: 06/22/2018    PT End of Session - 06/22/18 1601    Visit Number  2    Date for PT Re-Evaluation  08/31/18    PT Start Time  5053    PT Stop Time  1632    PT Time Calculation (min)  40 min    Activity Tolerance  Patient tolerated treatment well    Behavior During Therapy  [facility] for tasks assessed/performed        Past Medical History:  Diagnosis Date  . Anemia   . Family history of Turner syndrome 2008   w/ first pregnancy    Past Surgical History:  Procedure Laterality Date  . NO PAST SURGERIES      There were no vitals filed for this visit.    Subjective Assessment - 06/22/18 1553    Subjective  Went to work today.  Hiking this weekend made my back feel moresore.  It hasn't been as bad getting out of the car.  Overall, a little better   Currently in Pain?  Yes    Pain Score  3     Pain Location  Back    Pain Orientation  Mid    Pain Descriptors / Indicators  Aching    Pain Type  Acute pain    Pain Onset  1 to 4 weeks ago    Pain Frequency  Intermittent    Multiple Pain Sites  No                        [facility] Adult PT Treatment/Exercise - 06/24/18 0001      Exercises   Exercises  Lumbar      Lumbar Exercises: Seated   Long Arc Quad on Chair  Strengthening;Both;20 reps   TrA engaged   Other Seated Lumbar Exercises  pelvic tilt and circles on ball - 10 x each      Lumbar Exercises: Supine   Ab Set  10 reps   2 sets, 1 with ball squeeze   Bent Knee Raise  20 reps   with TrA     Manual Therapy   Manual Therapy  Soft tissue mobilization    Manual therapy comments  lumbar paraspinals in side lying              PT Education - 06/24/18 0745    Education Details   Access Code: [email or web address]     Person(s) Educated  Patient    Methods  Explanation;Demonstration;Verbal cues;Handout    Comprehension  Verbalized understanding;Returned demonstration          PT Long Term Goals - 06/24/18 0746      PT LONG TERM GOAL #1   Title  pt will report 50% less pain with rolling in bed    Baseline  8/10    Status  On-going      PT LONG TERM GOAL #2   Title  Pt will be ind with HEP    Baseline  started learning but needs review    Status  On-going      PT LONG TERM GOAL #3   Title  Pt will be able to perform her job duties with standing  for 12 hour shift without increased pain    Baseline  3/10 after 8 hour shift    Status  On-going      PT LONG TERM GOAL #4   Title  Pt will be able to coordinate core and pelvic floor muscles so she can get in and out of the car without increased pain    Baseline  has not noticed pain lately, but inconsistent still    Status  Partially Met            Plan - 06/24/18 0750    Clinical Impression Statement  Patient is doing better and making progress towards functional goals with slightly less pain.  She is still having a lot of pain with rolling and tension in low back and glutes.  She was able to perform more strengthening exercises and added to HEP today.  She will benefit from more skilled PT to progress core strength for healthy and functional pregnancy so she can continue to work and care for her family.    PT Treatment/Interventions  ADLs/Self Care Home Management;Biofeedback;Cryotherapy;Electrical Stimulation;Moist Heat;Therapeutic activities;Therapeutic exercise;Neuromuscular re-education;Manual techniques;Passive range of motion;Taping    PT Next Visit Plan  progress gentle core strength for decreased diastasis, transfer techniques, bed mobility    PT Home Exercise Plan   Access Code: [email or web address]     Consulted and Agree with Plan of Care  Patient        Patient will benefit from skilled therapeutic intervention in order to improve the following deficits and impairments:  Difficulty walking, Increased muscle spasms, Pain, Postural dysfunction, Impaired flexibility, Increased fascial restricitons, Decreased strength, Decreased range of motion  Visit Diagnosis: Muscle weakness (generalized)  Muscle spasm of back  Unspecified lack of coordination     Problem List Patient Active Problem List   Diagnosis Date Noted  . Family history of Turner syndrome 04/09/2018  . Supervision of other normal pregnancy, antepartum 03/19/2018  . History of herpes genitalis 12/31/2016    Jule Ser, PT 06/24/2018, 7:55 AM  [facility] [address], [place], [place], [address] Phone: [phone]   Fax:  [phone]  Name: Carys Malina MRN: [id number] Date of Birth: 04/02/1990  PHYSICAL THERAPY DISCHARGE SUMMARY  Visits from Start of Care: 2  Current functional level related to goals / functional outcomes: See above goals   Remaining deficits: See above details   Education / Equipment: HEP  Plan: Patient agrees to discharge.  Patient goals were not met. Patient is being discharged due to not returning since the last visit.  ?????     [employer], PT 08/12/18 1:58 PM

## 2018-06-24 ENCOUNTER — Encounter: Payer: Medicaid Other | Admitting: Physical Therapy

## 2018-06-24 NOTE — Patient Instructions (Signed)
Access Code: [email or web address]  URL: [email or web address]  Date: 06/24/2018  Prepared by: Dwana Curd   Exercises  Ball squeeze with Kegel - 10 reps - 1 sets - 3 sec hold - 3x daily - 7x weekly  Supine Posterior Pelvic Tilt - 10 reps - 3 sets - 1x daily - 7x weekly  Supine Figure 4 Piriformis Stretch - 3 reps - 1 sets - 30 sec hold - 1x daily - 7x weekly  Supine 12 Point Pelvic Clock - 2 reps - 1 sets - 1x daily - 7x weekly  Hooklying Small March - 10 reps - 2 sets - 1x daily - 7x weekly  Hooklying clam with kegel - 10 reps - 3 sets - 1x daily - 7x weekly  Seated Long Arc Quad with Hip Adduction - 10 reps - 3 sets - 1x daily - 7x weekly  Patient Education  Office Posture

## 2018-06-25 ENCOUNTER — Telehealth: Payer: Medicaid Other

## 2018-06-30 ENCOUNTER — Other Ambulatory Visit: Payer: Self-pay

## 2018-06-30 ENCOUNTER — Encounter: Payer: Self-pay | Admitting: Obstetrics

## 2018-06-30 ENCOUNTER — Telehealth (INDEPENDENT_AMBULATORY_CARE_PROVIDER_SITE_OTHER): Payer: Medicaid Other | Admitting: Obstetrics

## 2018-06-30 VITALS — BP 110/49 | HR 86 | Ht 64.0 in | Wt 189.2 lb

## 2018-06-30 DIAGNOSIS — Z3482 Encounter for supervision of other normal pregnancy, second trimester: Secondary | ICD-10-CM

## 2018-06-30 DIAGNOSIS — Z3480 Encounter for supervision of other normal pregnancy, unspecified trimester: Secondary | ICD-10-CM

## 2018-06-30 DIAGNOSIS — Z3A25 25 weeks gestation of pregnancy: Secondary | ICD-10-CM

## 2018-06-30 DIAGNOSIS — Z8619 Personal history of other infectious and parasitic diseases: Secondary | ICD-10-CM

## 2018-06-30 NOTE — Progress Notes (Signed)
ROB/Webex visit.

## 2018-06-30 NOTE — Progress Notes (Signed)
   TELEHEALTH OBSTETRICS PRENATAL VIRTUAL VIDEO VISIT ENCOUNTER NOTE  Provider location: Center for [employer] at [place]   I connected with [name] on 06/30/18 at 10:00 AM EDT by WebEx OB MyChart Video Encounter at home and verified that I am speaking with the correct person using two identifiers.   I discussed the limitations, risks, security and privacy concerns of performing an evaluation and management service by telephone and the availability of in person appointments. I also discussed with the patient that there may be a patient responsible charge related to this service. The patient expressed understanding and agreed to proceed. Subjective:  [name] is a 28 y.o. [id number] at [redacted]w[redacted]d being seen today for ongoing prenatal care.  She is currently monitored for the following issues for this low-risk pregnancy and has History of herpes genitalis; Supervision of other normal pregnancy, antepartum; and Family history of Turner syndrome on their problem list.  Patient reports no complaints.  Contractions: Not present. Vag. Bleeding: None.  Movement: Present. Denies any leaking of fluid.   The following portions of the patient's history were reviewed and updated as appropriate: allergies, current medications, past family history, past medical history, past social history, past surgical history and problem list.   Objective:   Vitals:   06/30/18 1007  BP: (!) 110/49  Pulse: 86  Weight: 189 lb 3.2 oz (85.8 kg)  Height: 5\' 4"  (1.626 m)    Fetal Status:     Movement: Present     General:  Alert, oriented and cooperative. Patient is in no acute distress.  Respiratory: Normal respiratory effort, no problems with respiration noted  Mental Status: Normal mood and affect. Normal behavior. Normal judgment and thought content.  Rest of physical exam deferred due to type of encounter  Imaging: No results found.  Assessment and Plan:  Pregnancy: [id number] at [redacted]w[redacted]d 1. Supervision of  other normal pregnancy, antepartum  2. History of herpes genitalis - clinically stable.  Will start suppression at 28 weeks    Preterm labor symptoms and general obstetric precautions including but not limited to vaginal bleeding, contractions, leaking of fluid and fetal movement were reviewed in detail with the patient. I discussed the assessment and treatment plan with the patient. The patient was provided an opportunity to ask questions and all were answered. The patient agreed with the plan and demonstrated an understanding of the instructions. The patient was advised to call back or seek an in-person office evaluation/go to MAU at [facility] for any urgent or concerning symptoms. Please refer to After Visit Summary for other counseling recommendations.   I provided 10 minutes of face-to-face time during this encounter.  Return in about 3 weeks (around 07/21/2018) for ROB, 2 hour OGTT.    Coral Ceo, MD Center for [facility], [facility] 06-30-2018

## 2018-07-21 ENCOUNTER — Other Ambulatory Visit: Payer: Medicaid Other

## 2018-07-21 ENCOUNTER — Ambulatory Visit (INDEPENDENT_AMBULATORY_CARE_PROVIDER_SITE_OTHER): Payer: Medicaid Other | Admitting: Obstetrics & Gynecology

## 2018-07-21 ENCOUNTER — Other Ambulatory Visit: Payer: Self-pay

## 2018-07-21 VITALS — BP 104/66 | HR 82 | Wt 196.4 lb

## 2018-07-21 DIAGNOSIS — Z3A28 28 weeks gestation of pregnancy: Secondary | ICD-10-CM

## 2018-07-21 DIAGNOSIS — Z3480 Encounter for supervision of other normal pregnancy, unspecified trimester: Secondary | ICD-10-CM

## 2018-07-21 DIAGNOSIS — Z8619 Personal history of other infectious and parasitic diseases: Secondary | ICD-10-CM

## 2018-07-21 DIAGNOSIS — Z3483 Encounter for supervision of other normal pregnancy, third trimester: Secondary | ICD-10-CM

## 2018-07-21 NOTE — Progress Notes (Signed)
   PRENATAL VISIT NOTE  Subjective:  [name] is a 28 y.o. [id number] at [redacted]w[redacted]d being seen today for ongoing prenatal care.  She is currently monitored for the following issues for this low-risk pregnancy and has History of herpes labialis; Supervision of other normal pregnancy, antepartum; and Family history of Turner syndrome on their problem list.  Patient reports no complaints.  Contractions: Irritability. Vag. Bleeding: None.  Movement: Present. Denies leaking of fluid.   The following portions of the patient's history were reviewed and updated as appropriate: allergies, current medications, past family history, past medical history, past social history, past surgical history and problem list.   Objective:   Vitals:   07/21/18 0823  BP: 104/66  Pulse: 82  Weight: 196 lb 6.4 oz (89.1 kg)    Fetal Status: Fetal Heart Rate (bpm): 156   Movement: Present     General:  Alert, oriented and cooperative. Patient is in no acute distress.  Skin: Skin is warm and dry. No rash noted.   Cardiovascular: Normal heart rate noted  Respiratory: Normal respiratory effort, no problems with respiration noted  Abdomen: Soft, gravid, appropriate for gestational age.  Pain/Pressure: Present     Pelvic: Cervical exam deferred        Extremities: Normal range of motion.  Edema: Trace  Mental Status: Normal mood and affect. Normal behavior. Normal judgment and thought content.   Assessment and Plan:  Pregnancy: [id number] at [redacted]w[redacted]d 1. Supervision of other normal pregnancy, antepartum Routine 28 weeks - CBC - Glucose Tolerance, 2 Hours w/1 Hour - RPR - HIV Antibody (routine testing w rflx)  Preterm labor symptoms and general obstetric precautions including but not limited to vaginal bleeding, contractions, leaking of fluid and fetal movement were reviewed in detail with the patient. Please refer to After Visit Summary for other counseling recommendations.   Return in about 3 weeks (around 08/11/2018) for  virtual.  No future appointments.  Emeterio Reeve, MD

## 2018-07-21 NOTE — Patient Instructions (Signed)

## 2018-07-21 NOTE — Progress Notes (Signed)
ROB/GTT. TDAP declined. 

## 2018-07-22 LAB — HIV ANTIBODY (ROUTINE TESTING W REFLEX): HIV Screen 4th Generation wRfx: NONREACTIVE

## 2018-07-22 LAB — CBC
Hematocrit: 30.1 % — ABNORMAL LOW (ref 34.0–46.6)
Hemoglobin: 10.5 g/dL — ABNORMAL LOW (ref 11.1–15.9)
MCH: 27.6 pg (ref 26.6–33.0)
MCHC: 34.9 g/dL (ref 31.5–35.7)
MCV: 79 fL (ref 79–97)
Platelets: 313 10*3/uL (ref 150–450)
RBC: 3.8 x10E6/uL (ref 3.77–5.28)
RDW: 12.6 % (ref 11.7–15.4)
WBC: 8.3 10*3/uL (ref 3.4–10.8)

## 2018-07-22 LAB — RPR: RPR Ser Ql: NONREACTIVE

## 2018-07-22 LAB — GLUCOSE TOLERANCE, 2 HOURS W/ 1HR
Glucose, 1 hour: 99 mg/dL (ref 65–179)
Glucose, 2 hour: 91 mg/dL (ref 65–152)
Glucose, Fasting: 73 mg/dL (ref 65–91)

## 2018-07-28 IMAGING — US US MFM OB FOLLOW-UP
1 series · 14 of 28 positions shown · non-contrast
Comparison: none

[Series 1: us mfm ob follow-up · 30 acquisitions, 14 frames shown]
[im 2/30]
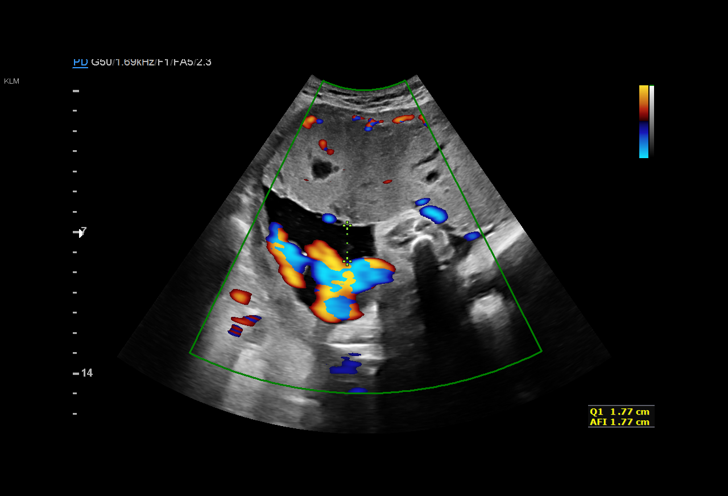
[im 4/30]
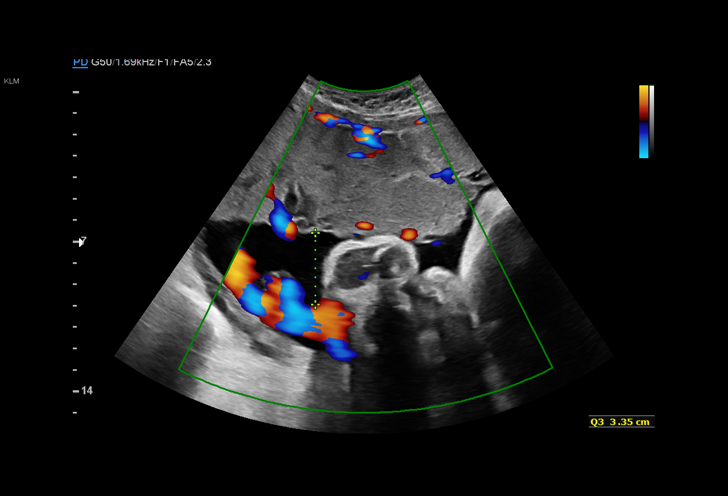
[im 6/30]
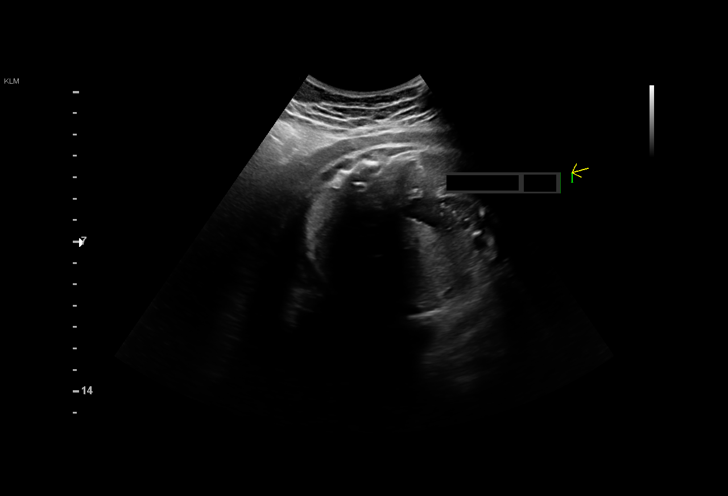
[im 8/30]
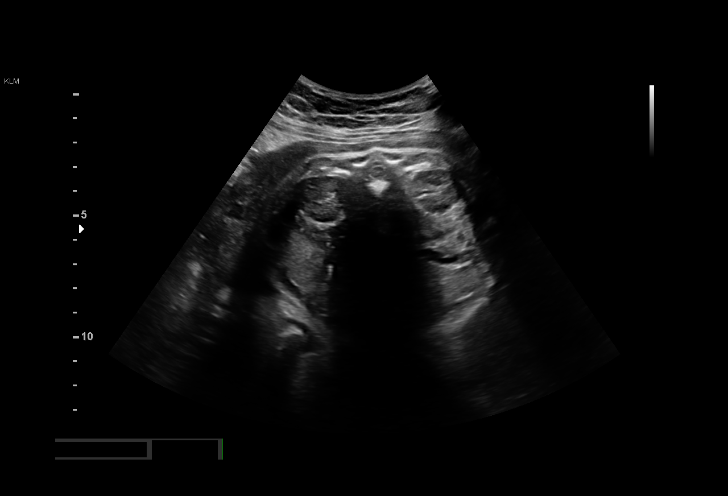
[im 10/30]
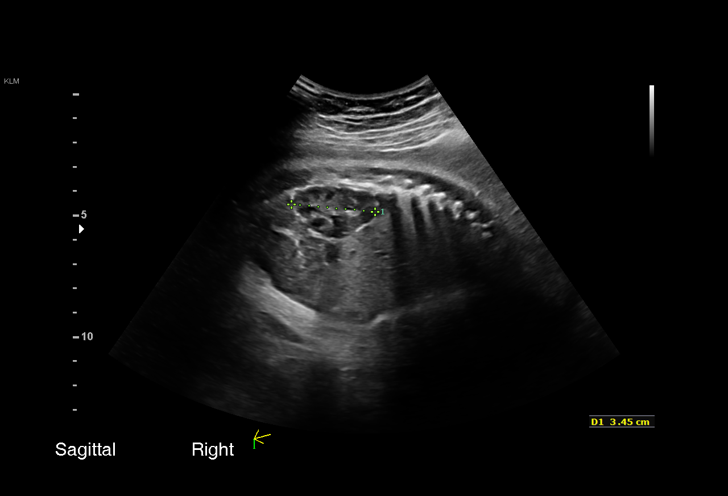
[im 12/30]
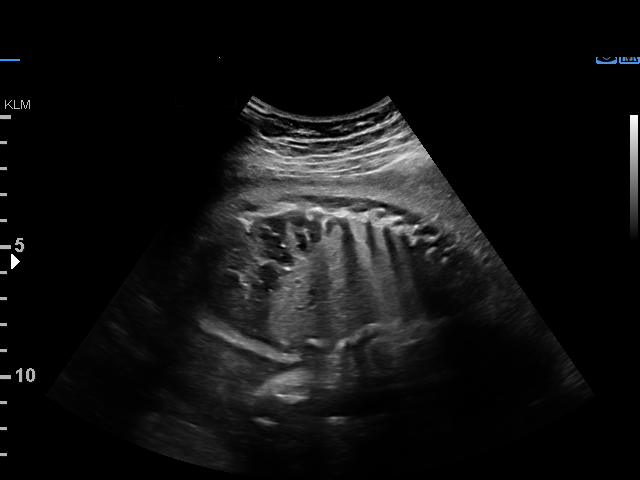
[im 14/30]
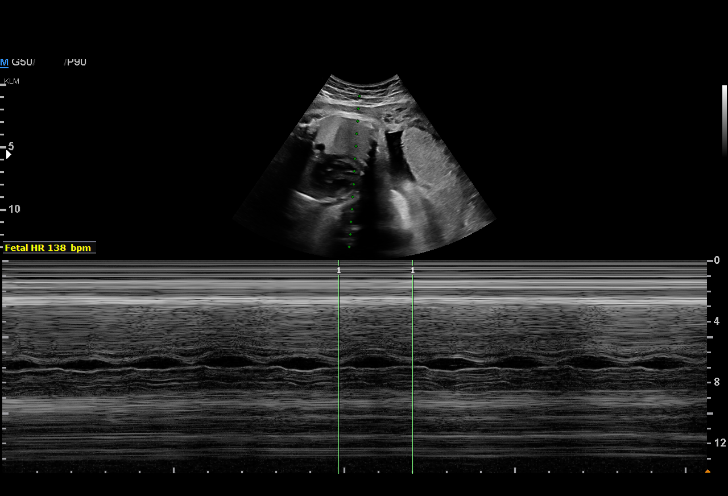
[im 17/30]
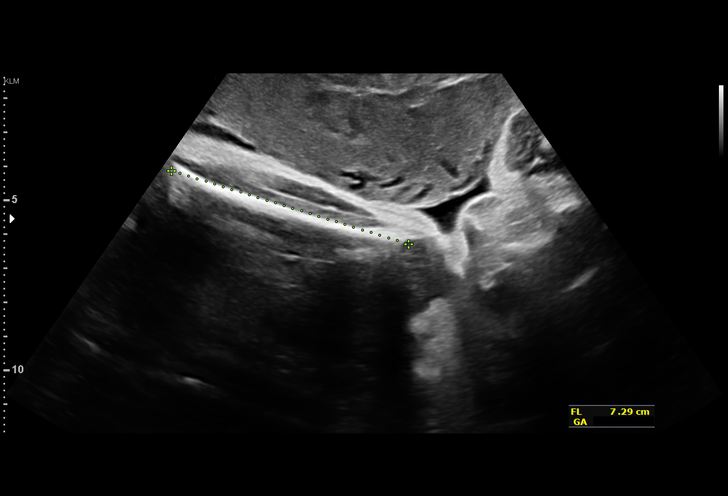
[im 19/30]
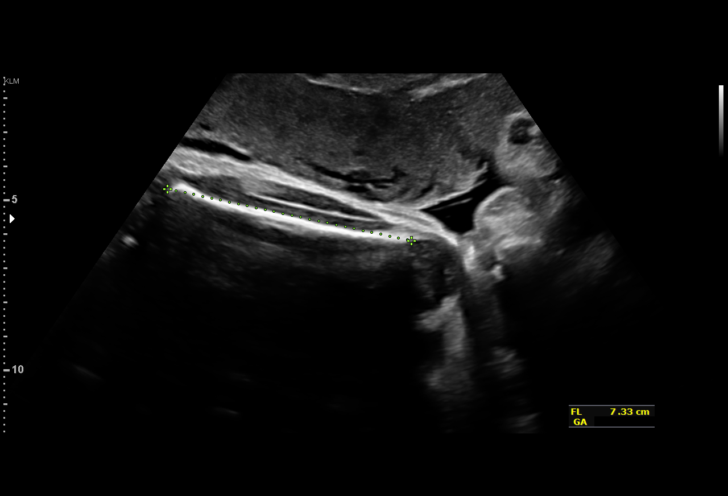
[im 21/30]
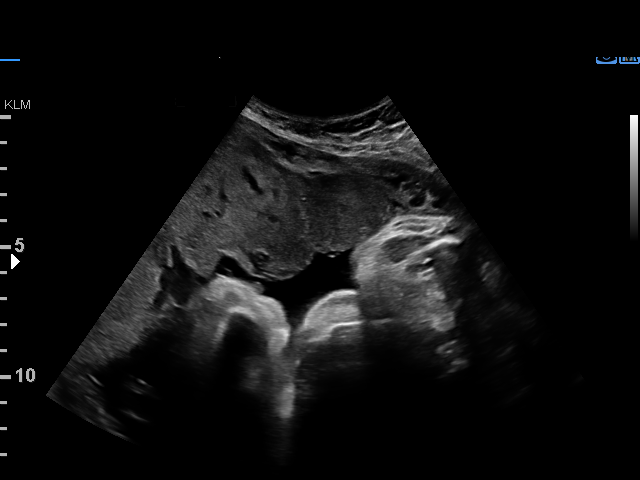
[im 23/30]
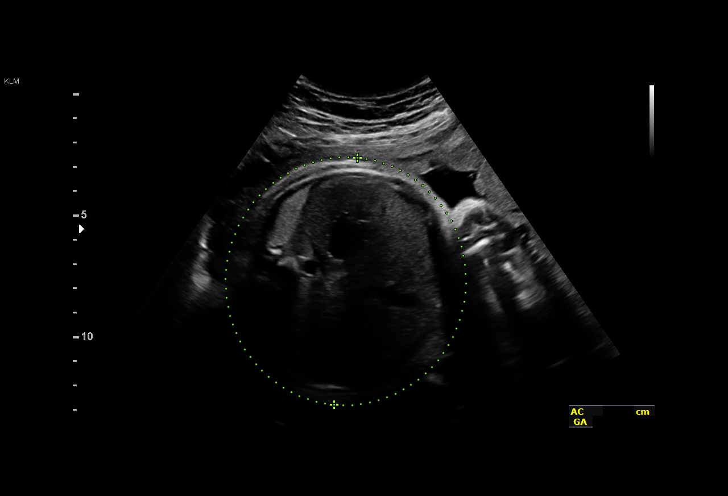
[im 25/30]
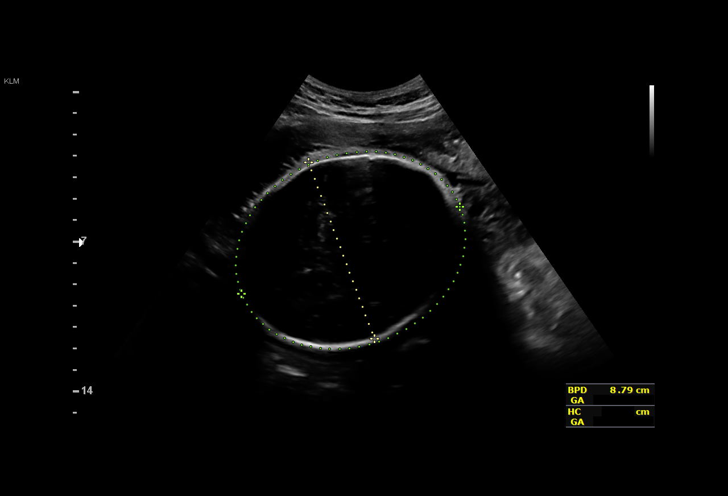
[im 27/30]
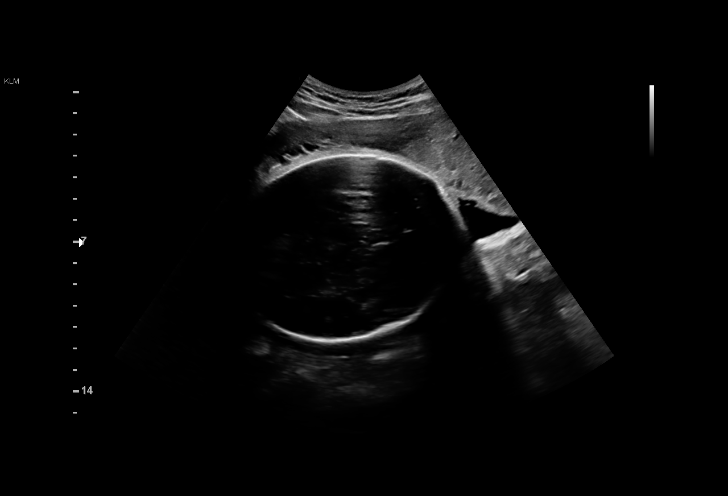
[im 30/30]
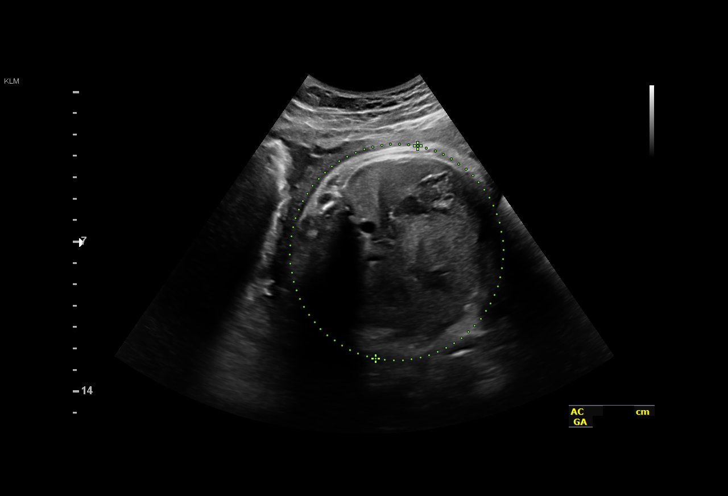

[14 of 28 positions shown; findings below may reference images not displayed]

Road [HOSPITAL]

Indications

36 weeks gestation of pregnancy
Small for gestational age fetus affecting
management of mother
OB History

Gravidity:    6         Term:   3        Prem:   0        SAB:   2
TOP:          0       Ectopic:  0        Living: 3
Fetal Evaluation

Num Of Fetuses:     1
Fetal Heart         138
Rate(bpm):
Cardiac Activity:   Observed
Presentation:       Cephalic
Placenta:           Anterior, above cervical os
P. Cord Insertion:  Previously Visualized

Amniotic Fluid
AFI FV:      Subjectively within normal limits

AFI Sum(cm)     %Tile       Largest Pocket(cm)
10.3            26

RUQ(cm)       RLQ(cm)       LUQ(cm)        LLQ(cm)
1.77
Biometry

BPD:      85.7  mm     G. Age:  34w 4d          9  %    CI:        73.75   %    70 - 86
FL/HC:      23.3   %    20.8 -
HC:       317   mm     G. Age:  35w 4d          7  %    HC/AC:      1.01        0.92 -
AC:      312.9  mm     G. Age:  35w 1d         20  %    FL/BPD:     86.2   %    71 - 87
FL:       73.9  mm     G. Age:  37w 6d         72  %    FL/AC:      23.6   %    20 - 24

Est. FW:    1160  gm      6 lb 3 oz     46  %
Gestational Age

LMP:           37w 1d        Date:  04/22/16                 EDD:   01/27/17
U/S Today:     35w 6d                                        EDD:   02/05/17
Best:          36w 6d     Det. By:  Early Ultrasound         EDD:   01/29/17
(06/03/16)
Anatomy

Cranium:               Appears normal         Aortic Arch:            Appears normal
Cavum:                 Appears normal         Ductal Arch:            Previously seen
Ventricles:            Previously seen        Diaphragm:              Previously seen
Choroid Plexus:        Previously seen        Stomach:                Appears normal, left
sided
Cerebellum:            Previously seen        Abdomen:                Appears normal
Posterior Fossa:       Previously seen        Abdominal Wall:         Previously seen
Nuchal Fold:           Previously seen        Cord Vessels:           Previously seen
Face:                  Orbits and profile     Kidneys:                Appear normal
previously seen
Lips:                  Previously seen        Bladder:                Appears normal
Thoracic:              Appears normal         Spine:                  Previously seen
Heart:                 Previously seen        Upper Extremities:      Previously seen
RVOT:                  Previously seen        Lower Extremities:      Previously seen
LVOT:                  Previously seen

Other:  Female gender. Heels and 5th digit previously visualized. Nasal bone
previously visualized. Open hands previously visualized.
Cervix Uterus Adnexa

Cervix
Not visualized (advanced GA >29wks)
Impression

SIUP at 36+6 weeks
Cephalic presentation
Normal interval anatomy; anatomic survey complete
Normal amniotic fluid volume
Appropriate interval growth with EFW at the 46th %tile
Recommendations

Follow-up as clinically indicated

## 2018-08-03 ENCOUNTER — Telehealth: Payer: Self-pay

## 2018-08-03 NOTE — Telephone Encounter (Signed)
TC to pt regarding B/P reading after call from [employer] B/P was 141/79 this morning. Pt repeated B/P just now at 2pm  B/P:125/32 Pt denies any swellng , no visual changes, noted mild HA pt states she has not drank much to any water today. Pt advised to increase water intake may try Tylenol  Pt advised if HA is still present, any dizziness, and increased swelling she may need to report to the hospital for an evaluation.  Pt voiced understanding.

## 2018-08-11 ENCOUNTER — Encounter: Payer: Self-pay | Admitting: Obstetrics

## 2018-08-11 ENCOUNTER — Telehealth (INDEPENDENT_AMBULATORY_CARE_PROVIDER_SITE_OTHER): Payer: Medicaid Other | Admitting: Obstetrics

## 2018-08-11 DIAGNOSIS — Z3483 Encounter for supervision of other normal pregnancy, third trimester: Secondary | ICD-10-CM

## 2018-08-11 DIAGNOSIS — Z3480 Encounter for supervision of other normal pregnancy, unspecified trimester: Secondary | ICD-10-CM

## 2018-08-11 DIAGNOSIS — Z3A31 31 weeks gestation of pregnancy: Secondary | ICD-10-CM

## 2018-08-11 NOTE — Progress Notes (Signed)
Pt does not have BP cuff with her for today's visit.  Pt states she is doing well.

## 2018-08-11 NOTE — Progress Notes (Signed)
   [place] VIRTUAL VIDEO VISIT ENCOUNTER NOTE  Provider location: [facility] for [facility] at [place]   I connected with Scharlene Gloss on 08/11/18 at 11:00 AM EDT by WebEx OB MyChart Video Encounter at home and verified that I am speaking with the correct person using two identifiers.   I discussed the limitations, risks, security and privacy concerns of performing an evaluation and management service virtually and the availability of in person appointments. I also discussed with the patient that there may be a patient responsible charge related to this service. The patient expressed understanding and agreed to proceed. Subjective:  [name] is a 28 y.o. [id number] at [redacted]w[redacted]d being seen today for ongoing prenatal care.  She is currently monitored for the following issues for this low-risk pregnancy and has History of herpes labialis; Supervision of other normal pregnancy, antepartum; and Family history of Turner syndrome on their problem list.  Patient reports no complaints.  Contractions: Not present. Vag. Bleeding: None.  Movement: Present. Denies any leaking of fluid.   The following portions of the patient's history were reviewed and updated as appropriate: allergies, current medications, past family history, past medical history, past social history, past surgical history and problem list.   Objective:  There were no vitals filed for this visit.  Fetal Status:     Movement: Present     General:  Alert, oriented and cooperative. Patient is in no acute distress.  Respiratory: Normal respiratory effort, no problems with respiration noted  Mental Status: Normal mood and affect. Normal behavior. Normal judgment and thought content.  Rest of physical exam deferred due to type of encounter  Imaging: No results found.  Assessment and Plan:  Pregnancy: [id number] at [redacted]w[redacted]d 1. Supervision of other normal pregnancy, antepartum   Preterm labor symptoms and general obstetric  precautions including but not limited to vaginal bleeding, contractions, leaking of fluid and fetal movement were reviewed in detail with the patient. I discussed the assessment and treatment plan with the patient. The patient was provided an opportunity to ask questions and all were answered. The patient agreed with the plan and demonstrated an understanding of the instructions. The patient was advised to call back or seek an in-person office evaluation/go to MAU at [facility] for any urgent or concerning symptoms. Please refer to After Visit Summary for other counseling recommendations.   I provided 10 minutes of face-to-face time during this encounter.  Return in about 2 weeks (around 08/25/2018) for MyChart.    Baltazar Najjar, MD Center for [facility], [facility] 08-11-2018

## 2018-08-13 ENCOUNTER — Telehealth: Payer: Self-pay

## 2018-08-13 NOTE — Telephone Encounter (Signed)
TC from pt to let us know she fell yesterday  Pt reports back pain +FM  No contractions, no LOF, no vaginal bleeding Pt advised for pain she can take Tylenol  Apply heating pad on low setting  Stretching and changing positions and rest.  Pt advised with any fall and MVA  She needs to report to MAU  Pt voiced understanding Pt stated she was at work and would go to MAU later.

## 2018-08-25 ENCOUNTER — Encounter: Payer: Self-pay | Admitting: Obstetrics

## 2018-08-25 ENCOUNTER — Telehealth (INDEPENDENT_AMBULATORY_CARE_PROVIDER_SITE_OTHER): Payer: Medicaid Other | Admitting: Obstetrics

## 2018-08-25 VITALS — BP 118/63 | HR 105

## 2018-08-25 DIAGNOSIS — Z3480 Encounter for supervision of other normal pregnancy, unspecified trimester: Secondary | ICD-10-CM

## 2018-08-25 DIAGNOSIS — Z3483 Encounter for supervision of other normal pregnancy, third trimester: Secondary | ICD-10-CM

## 2018-08-25 DIAGNOSIS — Z3A33 33 weeks gestation of pregnancy: Secondary | ICD-10-CM

## 2018-08-25 DIAGNOSIS — Z8619 Personal history of other infectious and parasitic diseases: Secondary | ICD-10-CM

## 2018-08-25 MED ORDER — MISC. DEVICES MISC: 0 refills | Status: DC

## 2018-08-25 NOTE — Progress Notes (Signed)
   [place] VIRTUAL VIDEO VISIT ENCOUNTER NOTE  Provider location: [facility] for [facility] at [place]   I connected with [name] on 08/25/18 at 11:15 AM EDT by WebEx OB MyChart Video Encounter at home and verified that I am speaking with the correct person using two identifiers.   I discussed the limitations, risks, security and privacy concerns of performing an evaluation and management service virtually and the availability of in person appointments. I also discussed with the patient that there may be a patient responsible charge related to this service. The patient expressed understanding and agreed to proceed. Subjective:  [name] is a 28 y.o. [id number] at [redacted]w[redacted]d being seen today for ongoing prenatal care.  She is currently monitored for the following issues for this low-risk pregnancy and has History of herpes labialis; Supervision of other normal pregnancy, antepartum; and Family history of Turner syndrome on their problem list.  Patient reports occasional contractions and pressure.  Contractions: Irritability. Vag. Bleeding: None.  Movement: Present. Denies any leaking of fluid.   The following portions of the patient's history were reviewed and updated as appropriate: allergies, current medications, past family history, past medical history, past social history, past surgical history and problem list.   Objective:   Vitals:   08/25/18 1140  BP: 118/63  Pulse: (!) 105    Fetal Status:     Movement: Present     General:  Alert, oriented and cooperative. Patient is in no acute distress.  Respiratory: Normal respiratory effort, no problems with respiration noted  Mental Status: Normal mood and affect. Normal behavior. Normal judgment and thought content.  Rest of physical exam deferred due to type of encounter  Imaging: No results found.  Assessment and Plan:  Pregnancy: [id number] at [redacted]w[redacted]d 1. Supervision of other normal pregnancy, antepartum  2.  History of herpes labialis   Preterm labor symptoms and general obstetric precautions including but not limited to vaginal bleeding, contractions, leaking of fluid and fetal movement were reviewed in detail with the patient. I discussed the assessment and treatment plan with the patient. The patient was provided an opportunity to ask questions and all were answered. The patient agreed with the plan and demonstrated an understanding of the instructions. The patient was advised to call back or seek an in-person office evaluation/go to MAU at [facility] for any urgent or concerning symptoms. Please refer to After Visit Summary for other counseling recommendations.   I provided 10 minutes of face-to-face time during this encounter.  Return in about 2 weeks (around 09/08/2018) for MyChart.   Baltazar Najjar, MD Center for [facility], [place] [facility] 08-25-2018

## 2018-08-25 NOTE — Progress Notes (Signed)
I connected with  [name] on 08/25/18 by a video enabled telemedicine application and verified that I am speaking with the correct person using two identifiers.   MyChart ROB. C/o pressure, swelling in her hands.

## 2018-08-25 NOTE — Addendum Note (Signed)
Addended by: Tristan Schroeder D on: 08/25/2018 03:50 PM   Modules accepted: Orders

## 2018-09-02 ENCOUNTER — Inpatient Hospital Stay (HOSPITAL_COMMUNITY)
Admission: AD | Admit: 2018-09-02 | Discharge: 2018-09-03 | Disposition: A | Discharge status: Home / Self Care | Payer: Medicaid Other | Source: Ambulatory Visit | Attending: Obstetrics & Gynecology | Admitting: Obstetrics & Gynecology

## 2018-09-02 ENCOUNTER — Other Ambulatory Visit: Payer: Self-pay

## 2018-09-02 DIAGNOSIS — M545 Low back pain, unspecified: Secondary | ICD-10-CM

## 2018-09-02 DIAGNOSIS — Z3A34 34 weeks gestation of pregnancy: Secondary | ICD-10-CM | POA: Insufficient documentation

## 2018-09-02 DIAGNOSIS — O4703 False labor before 37 completed weeks of gestation, third trimester: Secondary | ICD-10-CM | POA: Insufficient documentation

## 2018-09-02 DIAGNOSIS — Z3480 Encounter for supervision of other normal pregnancy, unspecified trimester: Secondary | ICD-10-CM

## 2018-09-02 DIAGNOSIS — O479 False labor, unspecified: Secondary | ICD-10-CM

## 2018-09-02 DIAGNOSIS — M549 Dorsalgia, unspecified: Secondary | ICD-10-CM | POA: Insufficient documentation

## 2018-09-03 ENCOUNTER — Encounter (HOSPITAL_COMMUNITY): Payer: Self-pay

## 2018-09-03 DIAGNOSIS — O4703 False labor before 37 completed weeks of gestation, third trimester: Secondary | ICD-10-CM | POA: Diagnosis not present

## 2018-09-03 DIAGNOSIS — O26893 Other specified pregnancy related conditions, third trimester: Secondary | ICD-10-CM

## 2018-09-03 DIAGNOSIS — O479 False labor, unspecified: Secondary | ICD-10-CM

## 2018-09-03 DIAGNOSIS — Z3A34 34 weeks gestation of pregnancy: Secondary | ICD-10-CM | POA: Diagnosis not present

## 2018-09-03 DIAGNOSIS — M545 Low back pain: Secondary | ICD-10-CM | POA: Diagnosis not present

## 2018-09-03 DIAGNOSIS — M549 Dorsalgia, unspecified: Secondary | ICD-10-CM | POA: Diagnosis not present

## 2018-09-03 LAB — WET PREP, GENITAL
Clue Cells Wet Prep HPF POC: NONE SEEN
Sperm: NONE SEEN
Trich, Wet Prep: NONE SEEN
WBC, Wet Prep HPF POC: NONE SEEN
Yeast Wet Prep HPF POC: NONE SEEN

## 2018-09-03 LAB — URINALYSIS, ROUTINE W REFLEX MICROSCOPIC
Bilirubin Urine: NEGATIVE
Glucose, UA: NEGATIVE mg/dL
Hgb urine dipstick: NEGATIVE
Ketones, ur: 15 mg/dL — AB
Leukocytes,Ua: NEGATIVE
Nitrite: NEGATIVE
Protein, ur: NEGATIVE mg/dL
Specific Gravity, Urine: 1.02 (ref 1.005–1.030)
pH: 6.5 (ref 5.0–8.0)

## 2018-09-03 MED ORDER — CYCLOBENZAPRINE HCL 10 MG PO TABS
10.0000 mg | ORAL_TABLET | Freq: Once | ORAL | Status: AC
  Administered 2018-09-03: 10 mg via ORAL
  Filled 2018-09-03: qty 1

## 2018-09-03 NOTE — MAU Provider Note (Signed)
History     CSN: [id number]  Arrival date and time: 09/02/18 2343   First Provider Initiated Contact with Patient 09/03/18 0017      Chief Complaint  Patient presents with  . Contractions   [name] is a 28 y.o. [id number] at [redacted]w[redacted]d who receives care at [facility].  She presents today for [employer] and Back Pain.  She states she started experiencing her contractions around 3pm and they have remained consistent.  She rates them a 7/10 and describes them as "baby contractions" in her lower abdomen.  She states the back pain is constant and starts in the middle of her back and radiates down.  She reports taking tylenol, around 9pm, without relief.  Patient endorses fetal movement and denies vaginal concerns including discharge, leaking, or bleeding.  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  Patient further reports that she did not work today, but did work 12 hours yesterday.      OB History    Gravida  8   Para  4   Term  4   Preterm  0   AB  2   Living  4     SAB  0   TAB  1   Ectopic  1   Multiple  0   Live Births  4           Past Medical History:  Diagnosis Date  . Anemia   . Family history of Turner syndrome 2008   w/ first pregnancy    Past Surgical History:  Procedure Laterality Date  . NO PAST SURGERIES      Family History  Problem Relation Age of Onset  . Diabetes Mother   . Hyperlipidemia Mother   . Hypertension Mother     Social History   Tobacco Use  . Smoking status: Never Smoker  . Smokeless tobacco: Never Used  Substance Use Topics  . Alcohol use: No  . Drug use: No    Allergies:  Allergies  Allergen Reactions  . Pollen Extract Other (See Comments)    Migraines    Medications Prior to Admission  Medication Sig Dispense Refill Last Dose  . acetaminophen (TYLENOL) 500 MG tablet Take 1,000 mg by mouth every 6 (six) hours as needed for mild pain.    09/02/2018 at 2100  . Blood Pressure Monitoring (BLOOD PRESSURE CUFF) MISC 1 Device by Does not apply route once a week. (Patient not taking: Reported on 07/21/2018) 1 each 0   . DICLEGIS 10-10 MG TBEC Take 2 tablets by mouth at bedtime. Take 2 tablets at bedtime. If not helping can increase to 1 tablet in morning in addition to bedtime on day 3. (Patient not taking: Reported on 05/25/2018) 60 tablet 2   . Elastic Bandages & Supports (COMFORT FIT MATERNITY SUPP LG) MISC 1 Units daily by Does not apply route. (Patient not taking: Reported on 03/19/2018) 1 each 0   . Misc. Devices MISC Dispense one maternity belt for patient 1 Device 0   . ondansetron (ZOFRAN ODT) 4 MG disintegrating tablet Take 1 tablet (4 mg total) by mouth every 8 (eight) hours as needed for nausea or vomiting. (Patient not taking: Reported on 05/20/2018) 30 tablet 2   . Prenat-FeAsp-Meth-FA-DHA w/o A (PRENATE PIXIE) 10-0.6-0.4-200 MG CAPS Take 1 tablet by mouth daily. (Patient not taking: Reported on 03/19/2018) 30 capsule 12   . Prenat-FePoly-Metf-FA-DHA-DSS (VITAFOL FE+) 90-1-200 & 50  MG CPPK Take 1 tablet by mouth daily. (Patient not taking: Reported on 05/20/2018) 60 each 3   . Prenatal Vit-Fe Phos-FA-Omega (VITAFOL GUMMIES) 3.33-0.333-34.8 MG CHEW Chew 3 each by mouth as directed. Chew 3 Once a Day 90 tablet 11   . promethazine (PHENERGAN) 12.5 MG tablet Take 1 tablet (12.5 mg total) by mouth every 6 (six) hours as needed for nausea or vomiting. (Patient not taking: Reported on 03/19/2018) 30 tablet 0   . Vitamin D, Ergocalciferol, (DRISDOL) 50000 units CAPS capsule Take 1 capsule (50,000 Units total) by mouth every 7 (seven) days. (Patient not taking: Reported on 03/19/2018) 30 capsule 2     Review of Systems  Constitutional: Negative for chills and fever.  Respiratory: Negative for cough and shortness of breath.   Gastrointestinal: Positive for abdominal pain and constipation (Two days ago without difficulty. ). Negative for  diarrhea, nausea and vomiting.  Genitourinary: Negative for difficulty urinating, dysuria, vaginal bleeding and vaginal discharge.  Musculoskeletal: Positive for back pain.  Neurological: Negative for dizziness, light-headedness and headaches.   Physical Exam   Blood pressure 123/63, pulse 88, temperature 98.7 F (37.1 C), resp. rate 19, last menstrual period 01/02/2018, SpO2 99 %, unknown if currently breastfeeding.  Physical Exam  Constitutional: She is oriented to person, place, and time. She appears well-developed and well-nourished. No distress.  HENT:  Head: Normocephalic and atraumatic.  Eyes: Conjunctivae are normal.  Neck: Normal range of motion.  Cardiovascular: Normal rate, regular rhythm and normal heart sounds.  Respiratory: Effort normal and breath sounds normal.  GI: Soft. Bowel sounds are normal.  Genitourinary: Cervix exhibits motion tenderness.    Vaginal discharge present.     Genitourinary Comments: Sterile Speculum Exam: -Vaginal Vault: Pink mucosa.  Moderate amt thick white discharge -wet prep collected -Cervix:Difficult to visualize due to position. GC/CT collected -Bimanual Exam: Closed/Long   Musculoskeletal: Normal range of motion.  Neurological: She is alert and oriented to person, place, and time.  Skin: Skin is warm and dry.  Psychiatric: She has a normal mood and affect. Her behavior is normal.    Fetal Assessment 145 bpm, Mod Var, -Decels, +Accels Toco: None graphed  MAU Course   Results for orders placed or performed during the hospital encounter of 09/02/18 (from the past 24 hour(s))  Urinalysis, Routine w reflex microscopic     Status: Abnormal   Collection Time: 09/03/18 12:38 AM  Result Value Ref Range   Color, Urine YELLOW YELLOW   APPearance CLEAR CLEAR   Specific Gravity, Urine 1.020 1.005 - 1.030   pH 6.5 5.0 - 8.0   Glucose, UA NEGATIVE NEGATIVE mg/dL   Hgb urine dipstick NEGATIVE NEGATIVE   Bilirubin Urine NEGATIVE NEGATIVE    Ketones, ur 15 (A) NEGATIVE mg/dL   Protein, ur NEGATIVE NEGATIVE mg/dL   Nitrite NEGATIVE NEGATIVE   Leukocytes,Ua NEGATIVE NEGATIVE  Wet prep, genital     Status: None   Collection Time: 09/03/18 12:39 AM   Specimen: Thin Prep Cervical/Endocervical  Result Value Ref Range   Yeast Wet Prep HPF POC NONE SEEN NONE SEEN   Trich, Wet Prep NONE SEEN NONE SEEN   Clue Cells Wet Prep HPF POC NONE SEEN NONE SEEN   WBC, Wet Prep HPF POC NONE SEEN NONE SEEN   Sperm NONE SEEN    No results found.  MDM PE Labs: Wet prep, UA EFM Muscle Relaxant Assessment and Plan  28 year old [id number]  SIUP at 34.6 weeks Cat I FT BH  Contractions Back Pain   -Exam findings discussed -Cultures collected -Will give flexeril 10mg  now. Patient reports that she drove herself, but is able to call for ride if feeling too tired from dosing. -Will await results and reassess.  -NST Reactive  Reassessment (1:36 AM)  -Wet prep returns with insignificant findings. -Results discussed with patient. -Informed that GC/CT will return within 2-3 days. -Patient reports improvement in pain and states "I don't have any pain right now."  -Declines scrip for flexeril usage at home. -Encouraged continued usage of belly band and compression stockings at work. -Encouraged to call or return to MAU if symptoms worsen or with the onset of new symptoms. -Discharged to home in improved condition.   Cherre RobinsJessica L Zyon Grout MSN, CNM 09/03/2018, 12:18 AM

## 2018-09-03 NOTE — MAU Note (Signed)
Reports feeling ctx occasionally.  No LOF/VB.  + FM.  Reports no complications w/ pregnancy.

## 2018-09-03 NOTE — Discharge Instructions (Signed)

## 2018-09-04 LAB — GC/CHLAMYDIA PROBE AMP (~~LOC~~) NOT AT ARMC
Chlamydia: NEGATIVE
Neisseria Gonorrhea: NEGATIVE

## 2018-09-08 ENCOUNTER — Telehealth: Payer: Self-pay

## 2018-09-08 NOTE — Telephone Encounter (Signed)
Patient is requesting rx for flexeril for back pain, she believes she pulled a muscle. Pt denies contractions, denies bleeding, reports good fetal movement.

## 2018-09-09 ENCOUNTER — Encounter: Payer: Self-pay | Admitting: Certified Nurse Midwife

## 2018-09-09 ENCOUNTER — Telehealth (INDEPENDENT_AMBULATORY_CARE_PROVIDER_SITE_OTHER): Payer: Medicaid Other | Admitting: Certified Nurse Midwife

## 2018-09-09 ENCOUNTER — Other Ambulatory Visit: Payer: Self-pay | Admitting: Obstetrics

## 2018-09-09 VITALS — BP 93/52

## 2018-09-09 DIAGNOSIS — Z8619 Personal history of other infectious and parasitic diseases: Secondary | ICD-10-CM

## 2018-09-09 DIAGNOSIS — Z3483 Encounter for supervision of other normal pregnancy, third trimester: Secondary | ICD-10-CM

## 2018-09-09 DIAGNOSIS — M549 Dorsalgia, unspecified: Secondary | ICD-10-CM

## 2018-09-09 DIAGNOSIS — Z3A35 35 weeks gestation of pregnancy: Secondary | ICD-10-CM

## 2018-09-09 DIAGNOSIS — Z3480 Encounter for supervision of other normal pregnancy, unspecified trimester: Secondary | ICD-10-CM

## 2018-09-09 MED ORDER — CYCLOBENZAPRINE HCL 10 MG PO TABS: 10.0000 mg | ORAL_TABLET | Freq: Three times a day (TID) | ORAL | 1 refills | Status: DC | PRN

## 2018-09-09 MED ORDER — VALACYCLOVIR HCL 500 MG PO TABS: 500.0000 mg | ORAL_TABLET | Freq: Two times a day (BID) | ORAL | 1 refills | Status: DC

## 2018-09-09 NOTE — Progress Notes (Signed)
   [place] VIRTUAL VIDEO VISIT ENCOUNTER NOTE  Provider location: [facility] for [facility] at [place]   I connected with [name] on 09/09/18 at  3:42 PM EDT by MyChart Video Encounter at work and verified that I am speaking with the correct person using two identifiers.   I discussed the limitations, risks, security and privacy concerns of performing an evaluation and management service virtually and the availability of in person appointments. I also discussed with the patient that there may be a patient responsible charge related to this service. The patient expressed understanding and agreed to proceed. Subjective:  [name] is a 28 y.o. [phone] at [redacted]w[redacted]d being seen today for ongoing prenatal care.  She is currently monitored for the following issues for this low-risk pregnancy and has History of herpes labialis; Supervision of other normal pregnancy, antepartum; and Family history of Turner syndrome on their problem list.  Patient reports backache.  Contractions: Not present.  .  Movement: Present. Denies any leaking of fluid.   The following portions of the patient's history were reviewed and updated as appropriate: allergies, current medications, past family history, past medical history, past social history, past surgical history and problem list.   Objective:   Vitals:   09/09/18 1518  BP: (!) 93/52    Fetal Status:     Movement: Present     General:  Alert, oriented and cooperative. Patient is in no acute distress.  Respiratory: Normal respiratory effort, no problems with respiration noted  Mental Status: Normal mood and affect. Normal behavior. Normal judgment and thought content.  Rest of physical exam deferred due to type of encounter  Imaging: No results found.  Assessment and Plan:  Pregnancy: [id number] at [redacted]w[redacted]d 1. Supervision of other normal pregnancy, antepartum - Patient doing well, reports back pain especially while at work, Rx for  flexeril sent this morning by Dr Jodi Mourning  - Routine prenatal care - Anticipatory guidance on upcoming appointments with in person appointment for GBS swab, patient reports only being available on 8/12- patient scheduled for that day  - COVID 19 precautions   2. History of herpes labialis - Rx for suppression sent to pharmacy of choice  - valACYclovir (VALTREX) 500 MG tablet; Take 1 tablet (500 mg total) by mouth 2 (two) times daily.  Dispense: 60 tablet; Refill: 1  Preterm labor symptoms and general obstetric precautions including but not limited to vaginal bleeding, contractions, leaking of fluid and fetal movement were reviewed in detail with the patient. I discussed the assessment and treatment plan with the patient. The patient was provided an opportunity to ask questions and all were answered. The patient agreed with the plan and demonstrated an understanding of the instructions. The patient was advised to call back or seek an in-person office evaluation/go to MAU at [facility] for any urgent or concerning symptoms. Please refer to After Visit Summary for other counseling recommendations.   I provided 7 minutes of face-to-face time during this encounter.  Return in about 1 week (around 09/16/2018) for ROB/GBS.  Future Appointments  Date Time Provider [place]  09/16/2018  8:45 AM Lajean Manes, CNM [place] None    Lajean Manes, [place] for [employer] [facility], [place]

## 2018-09-09 NOTE — Progress Notes (Signed)
I connected with  Scharlene Gloss on 09/09/18 at  3:45 PM EDT by telephone and verified that I am speaking with the correct person using two identifiers.   I discussed the limitations, risks, security and privacy concerns of performing an evaluation and management service by telephone and the availability of in person appointments. I also discussed with the patient that there may be a patient responsible charge related to this service. The patient expressed understanding and agreed to proceed.  [place], CMA 09/09/2018  3:12 PM

## 2018-09-16 ENCOUNTER — Ambulatory Visit (INDEPENDENT_AMBULATORY_CARE_PROVIDER_SITE_OTHER): Payer: Medicaid Other | Admitting: Certified Nurse Midwife

## 2018-09-16 ENCOUNTER — Encounter: Payer: Self-pay | Admitting: Certified Nurse Midwife

## 2018-09-16 ENCOUNTER — Other Ambulatory Visit (HOSPITAL_COMMUNITY)
Admission: RE | Admit: 2018-09-16 | Discharge: 2018-09-16 | Disposition: A | Discharge status: Home / Self Care | Payer: Medicaid Other | Source: Ambulatory Visit | Attending: Certified Nurse Midwife | Admitting: Certified Nurse Midwife

## 2018-09-16 ENCOUNTER — Other Ambulatory Visit: Payer: Self-pay

## 2018-09-16 VITALS — BP 116/79 | HR 109 | Wt 205.9 lb

## 2018-09-16 DIAGNOSIS — Z8619 Personal history of other infectious and parasitic diseases: Secondary | ICD-10-CM

## 2018-09-16 DIAGNOSIS — Z3480 Encounter for supervision of other normal pregnancy, unspecified trimester: Secondary | ICD-10-CM | POA: Insufficient documentation

## 2018-09-16 DIAGNOSIS — Z3A36 36 weeks gestation of pregnancy: Secondary | ICD-10-CM

## 2018-09-16 DIAGNOSIS — Z3483 Encounter for supervision of other normal pregnancy, third trimester: Secondary | ICD-10-CM

## 2018-09-16 NOTE — Patient Instructions (Addendum)
Reasons to go to MAU:  1.  Contractions are  5 minutes apart or less, each last 1 minute, these have been going on for 1-2 hours, and you cannot walk or talk during them 2.  You have a large gush of fluid, or a trickle of fluid that will not stop and you have to wear a pad 3.  You have bleeding that is bright red, heavier than spotting--like menstrual bleeding (spotting can be normal in early labor or after a check of your cervix) 4.  You do not feel the baby moving like he/she normally does   Cervical Ripening: May try one or both  Red Raspberry Leaf capsules:  two 300mg or 400mg tablets with each meal, 2-3 times a day  Potential Side Effects Of Raspberry Leaf:  Most women do not experience any side effects from drinking raspberry leaf tea. However, nausea and loose stools are possible   Evening Primrose Oil capsules: may take 1 to 3 capsules daily. May also prick one to release the oil and insert it into your vagina at night.  Some of the potential side effects:  Upset stomach  Loose stools or diarrhea  Headaches  Nausea:    

## 2018-09-16 NOTE — Progress Notes (Signed)
Pt presents for ROB. Pt has no concerns today. 

## 2018-09-16 NOTE — Progress Notes (Signed)
   PRENATAL VISIT NOTE  Subjective:  [name] is a 28 y.o. [phone] at [redacted]w[redacted]d being seen today for ongoing prenatal care.  She is currently monitored for the following issues for this low-risk pregnancy and has History of herpes labialis; Supervision of other normal pregnancy, antepartum; and Family history of Turner syndrome on their problem list.  Patient reports no complaints.  Contractions: Irregular. Vag. Bleeding: None.  Movement: Present. Denies leaking of fluid.   The following portions of the patient's history were reviewed and updated as appropriate: allergies, current medications, past family history, past medical history, past social history, past surgical history and problem list.   Objective:   Vitals:   09/16/18 0853  BP: 116/79  Pulse: (!) 109  Weight: 205 lb 14.4 oz (93.4 kg)    Fetal Status: Fetal Heart Rate (bpm): 150 Fundal Height: 34 cm Movement: Present  Presentation: Vertex  General:  Alert, oriented and cooperative. Patient is in no acute distress.  Skin: Skin is warm and dry. No rash noted.   Cardiovascular: Normal heart rate noted  Respiratory: Normal respiratory effort, no problems with respiration noted  Abdomen: Soft, gravid, appropriate for gestational age.  Pain/Pressure: Present     Pelvic: Cervical exam performed Dilation: Fingertip Effacement (%): Thick Station: -3  Extremities: Normal range of motion.  Edema: None  Mental Status: Normal mood and affect. Normal behavior. Normal judgment and thought content.   Assessment and Plan:  Pregnancy: [id number] at [redacted]w[redacted]d 1. Supervision of other normal pregnancy, antepartum - Patient doing well, no complaints - Routine prenatal care - Anticipatory guidance on upcoming appointments  - Patient request to be out of work starting at 25 weeks, she works as a Marine scientist, letter given to patient to be out starting 8/21 - COVID19 precautions discussed - Strep Gp B NAA - Cervicovaginal ancillary only( [place])  2.  History of herpes labialis - Currently on suppression   Preterm labor symptoms and general obstetric precautions including but not limited to vaginal bleeding, contractions, leaking of fluid and fetal movement were reviewed in detail with the patient. Please refer to After Visit Summary for other counseling recommendations.   Return in about 15 days (around 10/01/2018) for ROB/membrane sweep.  Future Appointments  Date Time Provider [place]  10/01/2018 10:30 AM Lajean Manes, CNM [place] None   Lajean Manes, [place]

## 2018-09-18 LAB — CERVICOVAGINAL ANCILLARY ONLY
Bacterial vaginitis: NEGATIVE
Candida vaginitis: NEGATIVE
Chlamydia: NEGATIVE
Neisseria Gonorrhea: NEGATIVE
Trichomonas: NEGATIVE

## 2018-09-18 LAB — STREP GP B NAA: Strep Gp B NAA: NEGATIVE

## 2018-09-22 NOTE — Progress Notes (Signed)
Patient requested to change maternity leave date.  Ok per Dr.Harper to change date.

## 2018-10-01 ENCOUNTER — Encounter: Payer: Self-pay | Admitting: Certified Nurse Midwife

## 2018-10-01 ENCOUNTER — Ambulatory Visit (INDEPENDENT_AMBULATORY_CARE_PROVIDER_SITE_OTHER): Payer: Medicaid Other | Admitting: Certified Nurse Midwife

## 2018-10-01 ENCOUNTER — Other Ambulatory Visit: Payer: Self-pay

## 2018-10-01 VITALS — BP 104/68 | HR 96 | Wt 208.8 lb

## 2018-10-01 DIAGNOSIS — Z3483 Encounter for supervision of other normal pregnancy, third trimester: Secondary | ICD-10-CM

## 2018-10-01 DIAGNOSIS — Z3A38 38 weeks gestation of pregnancy: Secondary | ICD-10-CM

## 2018-10-01 DIAGNOSIS — Z8619 Personal history of other infectious and parasitic diseases: Secondary | ICD-10-CM

## 2018-10-01 DIAGNOSIS — Z3480 Encounter for supervision of other normal pregnancy, unspecified trimester: Secondary | ICD-10-CM

## 2018-10-01 NOTE — Progress Notes (Signed)
   PRENATAL VISIT NOTE  Subjective:  [name] is a 28 y.o. [phone] at [redacted]w[redacted]d being seen today for ongoing prenatal care.  She is currently monitored for the following issues for this low-risk pregnancy and has History of herpes labialis; Supervision of other normal pregnancy, antepartum; and Family history of Turner syndrome on their problem list.  Patient reports occasional contractions.  Contractions: Irritability. Vag. Bleeding: None.  Movement: Present. Denies leaking of fluid.   The following portions of the patient's history were reviewed and updated as appropriate: allergies, current medications, past family history, past medical history, past social history, past surgical history and problem list.   Objective:   Vitals:   10/01/18 1045  BP: 104/68  Pulse: 96  Weight: 208 lb 12.8 oz (94.7 kg)    Fetal Status: Fetal Heart Rate (bpm): 158 Fundal Height: 36 cm Movement: Present  Presentation: Vertex  General:  Alert, oriented and cooperative. Patient is in no acute distress.  Skin: Skin is warm and dry. No rash noted.   Cardiovascular: Normal heart rate noted  Respiratory: Normal respiratory effort, no problems with respiration noted  Abdomen: Soft, gravid, appropriate for gestational age.  Pain/Pressure: Present     Pelvic: Cervical exam performed Dilation: 1 Effacement (%): 50 Station: -3  Extremities: Normal range of motion.  Edema: None  Mental Status: Normal mood and affect. Normal behavior. Normal judgment and thought content.   Assessment and Plan:  Pregnancy: [id number] at [redacted]w[redacted]d 1. Supervision of other normal pregnancy, antepartum - Patient doing well, "tired of being pregnant and wants baby to come out"  - Anticipatory guidance on upcoming appointments - Routine prenatal care - Educated and discussed use of EPO, RRT and IC to induce contractions  - Discussed membrane sweep on Monday if she does not go into labor on own prior to.  - Patient asks to be induced,  education and discussed elective induction with possibility of induction after next Thursday if not delivered prior.   2. History of herpes labialis - on suppression   Term labor symptoms and general obstetric precautions including but not limited to vaginal bleeding, contractions, leaking of fluid and fetal movement were reviewed in detail with the patient. Please refer to After Visit Summary for other counseling recommendations.   Return in about 4 days (around 10/05/2018) for ROB/membrane sweep.  Future Appointments  Date Time Provider [place]  10/05/2018 11:15 AM [place]-Kirby, Kathie Dike, CNM [facility] None    Lajean Manes, CNM

## 2018-10-01 NOTE — Patient Instructions (Addendum)
Reasons to go to MAU:  1.  Contractions are  5 minutes apart or less, each last 1 minute, these have been going on for 1-2 hours, and you cannot walk or talk during them 2.  You have a large gush of fluid, or a trickle of fluid that will not stop and you have to wear a pad 3.  You have bleeding that is bright red, heavier than spotting--like menstrual bleeding (spotting can be normal in early labor or after a check of your cervix) 4.  You do not feel the baby moving like he/she normally does   Cervical Ripening: May try one or both  Red Raspberry Leaf capsules:  two 300mg or 400mg tablets with each meal, 2-3 times a day  Potential Side Effects Of Raspberry Leaf:  Most women do not experience any side effects from drinking raspberry leaf tea. However, nausea and loose stools are possible   Evening Primrose Oil capsules: may take 1 to 3 capsules daily. May also prick one to release the oil and insert it into your vagina at night.  Some of the potential side effects:  Upset stomach  Loose stools or diarrhea  Headaches  Nausea:    

## 2018-10-01 NOTE — Progress Notes (Signed)
Pt is here for ROB. [redacted]w[redacted]d.

## 2018-10-05 ENCOUNTER — Telehealth: Payer: Self-pay | Admitting: Advanced Practice Midwife

## 2018-10-05 ENCOUNTER — Ambulatory Visit (INDEPENDENT_AMBULATORY_CARE_PROVIDER_SITE_OTHER): Payer: Medicaid Other | Admitting: Advanced Practice Midwife

## 2018-10-05 ENCOUNTER — Other Ambulatory Visit: Payer: Self-pay

## 2018-10-05 ENCOUNTER — Encounter: Payer: Self-pay | Admitting: Advanced Practice Midwife

## 2018-10-05 VITALS — BP 126/74 | HR 90 | Temp 97.9°F | Wt 208.0 lb

## 2018-10-05 DIAGNOSIS — M7918 Myalgia, other site: Secondary | ICD-10-CM

## 2018-10-05 DIAGNOSIS — Z3480 Encounter for supervision of other normal pregnancy, unspecified trimester: Secondary | ICD-10-CM

## 2018-10-05 DIAGNOSIS — O99891 Other specified diseases and conditions complicating pregnancy: Secondary | ICD-10-CM

## 2018-10-05 DIAGNOSIS — Z3A39 39 weeks gestation of pregnancy: Secondary | ICD-10-CM

## 2018-10-05 DIAGNOSIS — Z8619 Personal history of other infectious and parasitic diseases: Secondary | ICD-10-CM

## 2018-10-05 DIAGNOSIS — O9989 Other specified diseases and conditions complicating pregnancy, childbirth and the puerperium: Secondary | ICD-10-CM

## 2018-10-05 NOTE — Progress Notes (Signed)
   PRENATAL VISIT NOTE  Subjective:  [name] is a 28 y.o. [phone] at [redacted]w[redacted]d being seen today for ongoing prenatal care.  She is currently monitored for the following issues for this low-risk pregnancy and has History of herpes labialis; Supervision of other normal pregnancy, antepartum; and Family history of Turner syndrome on their problem list.  Patient reports constant clicking pelvic pain. This is an ongoing issue. Contractions: Irritability. Vag. Bleeding: None.  Movement: Present. Denies leaking of fluid.   The following portions of the patient's history were reviewed and updated as appropriate: allergies, current medications, past family history, past medical history, past social history, past surgical history and problem list.   Objective:   Vitals:   10/05/18 1119  BP: 126/74  Pulse: 90  Temp: 97.9 F (36.6 C)  Weight: 94.3 kg    Fetal Status: Fetal Heart Rate (bpm): 161   Movement: Present  Presentation: Vertex  General:  Alert, oriented and cooperative. Patient is in no acute distress.  Skin: Skin is warm and dry. No rash noted.   Cardiovascular: Normal heart rate noted  Respiratory: Normal respiratory effort, no problems with respiration noted  Abdomen: Soft, gravid, appropriate for gestational age.  Pain/Pressure: Present     Pelvic: Cervical exam performed Dilation: 2 Effacement (%): 50 Station: -3  Extremities: Normal range of motion.  Edema: None  Mental Status: Normal mood and affect. Normal behavior. Normal judgment and thought content.   Assessment and Plan:  Pregnancy: [id number] at [redacted]w[redacted]d  1. Supervision of other normal pregnancy, antepartum -Routine prenatal care -Requesting cervical exam and membrane sweep -Requesting IOL. "Wants baby to come out already." -Discussed ways to induce contractions and labor, including the [employer], evening primrose oil, red raspberry leaf tea, intercourse, and nipple stimulation -Anticipatory guidance for labor  2.  Pain in symphysis pubis during pregnancy -Pt to come to office on Friday (9/4) for cervical exam. Will discuss IOL then.   3. History of herpes labialis -Valtrex Rx  Term labor symptoms and general obstetric precautions including but not limited to vaginal bleeding, contractions, leaking of fluid and fetal movement were reviewed in detail with the patient. Please refer to After Visit Summary for other counseling recommendations.   Return in about 4 days (around 10/09/2018).  No future appointments.  Maryagnes Amos, SNM

## 2018-10-05 NOTE — Telephone Encounter (Signed)
error 

## 2018-10-05 NOTE — Progress Notes (Signed)
ROB states she recently lost mucous plug  Pt request cervix check.  Wants to discuss induction   Pt consent to student/resident being present during exam    CC: None

## 2018-10-05 NOTE — Telephone Encounter (Addendum)
Entered in error

## 2018-10-06 ENCOUNTER — Encounter (HOSPITAL_COMMUNITY): Payer: Self-pay

## 2018-10-06 ENCOUNTER — Inpatient Hospital Stay (HOSPITAL_COMMUNITY): Payer: Medicaid Other | Admitting: Anesthesiology

## 2018-10-06 ENCOUNTER — Other Ambulatory Visit: Payer: Self-pay

## 2018-10-06 ENCOUNTER — Inpatient Hospital Stay (HOSPITAL_COMMUNITY)
Admission: AD | Admit: 2018-10-06 | Discharge: 2018-10-07 | DRG: 806 | Disposition: A | Discharge status: Home / Self Care | Payer: Medicaid Other | Attending: Family Medicine | Admitting: Family Medicine

## 2018-10-06 DIAGNOSIS — O26893 Other specified pregnancy related conditions, third trimester: Secondary | ICD-10-CM | POA: Diagnosis present

## 2018-10-06 DIAGNOSIS — Z3A39 39 weeks gestation of pregnancy: Secondary | ICD-10-CM

## 2018-10-06 DIAGNOSIS — Z20828 Contact with and (suspected) exposure to other viral communicable diseases: Secondary | ICD-10-CM | POA: Diagnosis present

## 2018-10-06 DIAGNOSIS — A6000 Herpesviral infection of urogenital system, unspecified: Secondary | ICD-10-CM | POA: Diagnosis present

## 2018-10-06 DIAGNOSIS — O9832 Other infections with a predominantly sexual mode of transmission complicating childbirth: Secondary | ICD-10-CM | POA: Diagnosis present

## 2018-10-06 HISTORY — DX: 39 weeks gestation of pregnancy: Z3A.39

## 2018-10-06 LAB — TYPE AND SCREEN
ABO/RH(D): B POS
Antibody Screen: NEGATIVE

## 2018-10-06 LAB — CBC
HCT: 33.6 % — ABNORMAL LOW (ref 36.0–46.0)
Hemoglobin: 10.4 g/dL — ABNORMAL LOW (ref 12.0–15.0)
MCH: 24.7 pg — ABNORMAL LOW (ref 26.0–34.0)
MCHC: 31 g/dL (ref 30.0–36.0)
MCV: 79.8 fL — ABNORMAL LOW (ref 80.0–100.0)
Platelets: 337 10*3/uL (ref 150–400)
RBC: 4.21 MIL/uL (ref 3.87–5.11)
RDW: 15.4 % (ref 11.5–15.5)
WBC: 11.6 10*3/uL — ABNORMAL HIGH (ref 4.0–10.5)
nRBC: 0 % (ref 0.0–0.2)

## 2018-10-06 LAB — SARS CORONAVIRUS 2 BY RT PCR (HOSPITAL ORDER, PERFORMED IN ~~LOC~~ HOSPITAL LAB): SARS Coronavirus 2: NEGATIVE

## 2018-10-06 LAB — RPR: RPR Ser Ql: NONREACTIVE

## 2018-10-06 MED ORDER — WITCH HAZEL-GLYCERIN EX PADS: 1.0000 "application " | MEDICATED_PAD | CUTANEOUS | Status: DC | PRN

## 2018-10-06 MED ORDER — OXYTOCIN 40 UNITS IN NORMAL SALINE INFUSION - SIMPLE MED
1.0000 m[IU]/min | INTRAVENOUS | Status: DC
  Administered 2018-10-06: 2 m[IU]/min via INTRAVENOUS

## 2018-10-06 MED ORDER — LACTATED RINGERS IV SOLN
500.0000 mL | INTRAVENOUS | Status: DC | PRN
  Administered 2018-10-06: 1000 mL via INTRAVENOUS

## 2018-10-06 MED ORDER — VALACYCLOVIR HCL 500 MG PO TABS
500.0000 mg | ORAL_TABLET | Freq: Two times a day (BID) | ORAL | Status: DC
  Administered 2018-10-06 – 2018-10-07 (×3): 500 mg via ORAL
  Filled 2018-10-06 (×3): qty 1

## 2018-10-06 MED ORDER — SOD CITRATE-CITRIC ACID 500-334 MG/5ML PO SOLN: 30.0000 mL | ORAL | Status: DC | PRN

## 2018-10-06 MED ORDER — ACETAMINOPHEN 325 MG PO TABS
650.0000 mg | ORAL_TABLET | ORAL | Status: DC | PRN
  Administered 2018-10-06 – 2018-10-07 (×5): 650 mg via ORAL
  Filled 2018-10-06 (×5): qty 2

## 2018-10-06 MED ORDER — SENNOSIDES-DOCUSATE SODIUM 8.6-50 MG PO TABS
2.0000 | ORAL_TABLET | ORAL | Status: DC
  Administered 2018-10-06: 2 via ORAL
  Filled 2018-10-06: qty 2

## 2018-10-06 MED ORDER — TERBUTALINE SULFATE 1 MG/ML IJ SOLN: 0.2500 mg | Freq: Once | INTRAMUSCULAR | Status: DC | PRN

## 2018-10-06 MED ORDER — LIDOCAINE HCL (PF) 1 % IJ SOLN
INTRAMUSCULAR | Status: DC | PRN
  Administered 2018-10-06 (×2): 5 mL via EPIDURAL

## 2018-10-06 MED ORDER — SIMETHICONE 80 MG PO CHEW: 80.0000 mg | CHEWABLE_TABLET | ORAL | Status: DC | PRN

## 2018-10-06 MED ORDER — PHENYLEPHRINE 40 MCG/ML (10ML) SYRINGE FOR IV PUSH (FOR BLOOD PRESSURE SUPPORT): 80.0000 ug | PREFILLED_SYRINGE | INTRAVENOUS | Status: DC | PRN

## 2018-10-06 MED ORDER — OXYCODONE-ACETAMINOPHEN 5-325 MG PO TABS: 1.0000 | ORAL_TABLET | ORAL | Status: DC | PRN

## 2018-10-06 MED ORDER — ZOLPIDEM TARTRATE 5 MG PO TABS: 5.0000 mg | ORAL_TABLET | Freq: Every evening | ORAL | Status: DC | PRN

## 2018-10-06 MED ORDER — EPHEDRINE 5 MG/ML INJ: 10.0000 mg | INTRAVENOUS | Status: DC | PRN

## 2018-10-06 MED ORDER — SODIUM CHLORIDE (PF) 0.9 % IJ SOLN
INTRAMUSCULAR | Status: DC | PRN
  Administered 2018-10-06: 12 mL/h via EPIDURAL

## 2018-10-06 MED ORDER — IBUPROFEN 600 MG PO TABS
600.0000 mg | ORAL_TABLET | Freq: Four times a day (QID) | ORAL | Status: DC
  Administered 2018-10-06 – 2018-10-07 (×5): 600 mg via ORAL
  Filled 2018-10-06 (×5): qty 1

## 2018-10-06 MED ORDER — BENZOCAINE-MENTHOL 20-0.5 % EX AERO: 1.0000 "application " | INHALATION_SPRAY | CUTANEOUS | Status: DC | PRN

## 2018-10-06 MED ORDER — FENTANYL-BUPIVACAINE-NACL 0.5-0.125-0.9 MG/250ML-% EP SOLN
12.0000 mL/h | EPIDURAL | Status: DC | PRN
  Filled 2018-10-06: qty 250

## 2018-10-06 MED ORDER — TETANUS-DIPHTH-ACELL PERTUSSIS 5-2.5-18.5 LF-MCG/0.5 IM SUSP
0.5000 mL | Freq: Once | INTRAMUSCULAR | Status: AC
  Administered 2018-10-07: 0.5 mL via INTRAMUSCULAR
  Filled 2018-10-06: qty 0.5

## 2018-10-06 MED ORDER — DIPHENHYDRAMINE HCL 50 MG/ML IJ SOLN: 12.5000 mg | INTRAMUSCULAR | Status: DC | PRN

## 2018-10-06 MED ORDER — DIPHENHYDRAMINE HCL 25 MG PO CAPS
25.0000 mg | ORAL_CAPSULE | Freq: Four times a day (QID) | ORAL | Status: DC | PRN
  Administered 2018-10-06: 25 mg via ORAL
  Filled 2018-10-06: qty 1

## 2018-10-06 MED ORDER — PHENYLEPHRINE 40 MCG/ML (10ML) SYRINGE FOR IV PUSH (FOR BLOOD PRESSURE SUPPORT)
80.0000 ug | PREFILLED_SYRINGE | INTRAVENOUS | Status: DC | PRN
  Filled 2018-10-06: qty 10

## 2018-10-06 MED ORDER — MISOPROSTOL 25 MCG QUARTER TABLET
25.0000 ug | ORAL_TABLET | Freq: Once | ORAL | Status: AC
  Administered 2018-10-06: 25 ug via VAGINAL
  Filled 2018-10-06: qty 1

## 2018-10-06 MED ORDER — LACTATED RINGERS IV SOLN: 500.0000 mL | Freq: Once | INTRAVENOUS | Status: DC

## 2018-10-06 MED ORDER — LACTATED RINGERS IV SOLN
INTRAVENOUS | Status: DC
  Administered 2018-10-06: 02:00:00 via INTRAVENOUS

## 2018-10-06 MED ORDER — OXYTOCIN 40 UNITS IN NORMAL SALINE INFUSION - SIMPLE MED
2.5000 [IU]/h | INTRAVENOUS | Status: DC
  Filled 2018-10-06: qty 1000

## 2018-10-06 MED ORDER — ONDANSETRON HCL 4 MG PO TABS: 4.0000 mg | ORAL_TABLET | ORAL | Status: DC | PRN

## 2018-10-06 MED ORDER — TRANEXAMIC ACID-NACL 1000-0.7 MG/100ML-% IV SOLN
INTRAVENOUS | Status: AC
  Administered 2018-10-06: 1000 mg
  Filled 2018-10-06: qty 100

## 2018-10-06 MED ORDER — LIDOCAINE HCL (PF) 1 % IJ SOLN: 30.0000 mL | INTRAMUSCULAR | Status: DC | PRN

## 2018-10-06 MED ORDER — OXYCODONE-ACETAMINOPHEN 5-325 MG PO TABS: 2.0000 | ORAL_TABLET | ORAL | Status: DC | PRN

## 2018-10-06 MED ORDER — ONDANSETRON HCL 4 MG/2ML IJ SOLN: 4.0000 mg | INTRAMUSCULAR | Status: DC | PRN

## 2018-10-06 MED ORDER — PRENATAL MULTIVITAMIN CH
1.0000 | ORAL_TABLET | Freq: Every day | ORAL | Status: DC
  Administered 2018-10-06 – 2018-10-07 (×2): 1 via ORAL
  Filled 2018-10-06 (×2): qty 1

## 2018-10-06 MED ORDER — OXYTOCIN BOLUS FROM INFUSION
500.0000 mL | Freq: Once | INTRAVENOUS | Status: AC
  Administered 2018-10-06: 500 mL via INTRAVENOUS

## 2018-10-06 MED ORDER — COCONUT OIL OIL
1.0000 "application " | TOPICAL_OIL | Status: DC | PRN
  Administered 2018-10-06: 1 via TOPICAL

## 2018-10-06 MED ORDER — DIBUCAINE (PERIANAL) 1 % EX OINT: 1.0000 "application " | TOPICAL_OINTMENT | CUTANEOUS | Status: DC | PRN

## 2018-10-06 MED ORDER — OXYCODONE HCL 5 MG PO TABS
5.0000 mg | ORAL_TABLET | Freq: Once | ORAL | Status: AC
  Administered 2018-10-06: 5 mg via ORAL
  Filled 2018-10-06: qty 1

## 2018-10-06 MED ORDER — ACETAMINOPHEN 325 MG PO TABS: 650.0000 mg | ORAL_TABLET | ORAL | Status: DC | PRN

## 2018-10-06 MED ORDER — ONDANSETRON HCL 4 MG/2ML IJ SOLN: 4.0000 mg | Freq: Four times a day (QID) | INTRAMUSCULAR | Status: DC | PRN

## 2018-10-06 NOTE — Discharge Summary (Signed)
OB Discharge Summary     Patient Name: [name] DOB: [birth year] MRN: [id number]  Date of admission: 10/06/2018 Delivering MD: Merilyn Baba   Date of discharge: 10/07/2018  Admitting diagnosis: induction Intrauterine pregnancy: [redacted]w[redacted]d     Secondary diagnosis:  Active Problems:   [redacted] weeks gestation of pregnancy  Additional problems: None     Discharge diagnosis: Term Pregnancy Delivered                                                                                                Post partum procedures:None  Augmentation: Pitocin and Cytotec  Complications: None  Hospital course:  Induction of Labor With Vaginal Delivery   28 y.o. yo [id number] at [redacted]w[redacted]d was admitted to the hospital 10/06/2018 for induction of labor.  Indication for induction: elective.  Patient had an uncomplicated labor course as follows: Membrane Rupture Time/Date: 9:51 AM ,10/06/2018   Intrapartum Procedures: Episiotomy: None [1]                                         Lacerations:  None [1]  Patient had delivery of a Viable infant.  Information for the patient's newborn:  Meredyth, [place]ung Girl Ilyana [[id number]]  Delivery Method: Vaginal, Spontaneous(Filed from Delivery Summary)    10/06/2018  Details of delivery can be found in separate delivery note. Micronor prescribed on DC. Patient had a routine postpartum course. Patient is discharged home 10/07/18.  Physical exam  Vitals:   10/06/18 1500 10/06/18 2040 10/06/18 2315 10/07/18 0509  BP: (!) 111/57 (!) 108/56 (!) 103/57 127/67  Pulse: 62 71 76 63  Resp: 16 18 18 18   Temp: 97.6 F (36.4 C) 98.4 F (36.9 C) 98.2 F (36.8 C) 97.9 F (36.6 C)  TempSrc: Oral Oral Oral Axillary  SpO2:  100% 99% 100%  Weight:      Height:       General: alert, cooperative and no distress Lochia: appropriate Uterine Fundus: firm Incision: N/A DVT Evaluation: No evidence of DVT seen on physical exam. Labs: Lab Results  Component Value Date   WBC 11.7 (H) 10/07/2018   HGB  9.9 (L) 10/07/2018   HCT 30.8 (L) 10/07/2018   MCV 78.6 (L) 10/07/2018   PLT 294 10/07/2018   CMP Latest Ref Rng & Units 01/16/2014  Glucose 70 - 99 mg/dL 93  BUN 6 - 23 mg/dL 12  Creatinine 0.50 - 1.10 mg/dL 0.62  Sodium 137 - 147 mEq/L 136(L)  Potassium 3.7 - 5.3 mEq/L 3.4(L)  Chloride 96 - 112 mEq/L 100  CO2 19 - 32 mEq/L 23  Calcium 8.4 - 10.5 mg/dL 9.2  Total Protein 6.0 - 8.3 g/dL 7.1  Total Bilirubin 0.3 - 1.2 mg/dL 0.4  Alkaline Phos 39 - 117 U/L 54  AST 0 - 37 U/L 14  ALT 0 - 35 U/L 13    Discharge instruction: per After Visit Summary and "Baby and Me Booklet".  After visit meds:  Allergies as of 10/07/2018  Reactions   Pollen Extract Other (See Comments)   Migraines      Medication List    STOP taking these medications   valACYclovir 500 MG tablet Commonly known as: VALTREX     TAKE these medications   acetaminophen 500 MG tablet Commonly known as: TYLENOL Take 1,000 mg by mouth every 6 (six) hours as needed for mild pain. What changed: Another medication with the same name was added. Make sure you understand how and when to take each.   acetaminophen 325 MG tablet Commonly known as: Tylenol Take 2 tablets (650 mg total) by mouth every 4 (four) hours as needed (for pain scale < 4). What changed: You were already taking a medication with the same name, and this prescription was added. Make sure you understand how and when to take each.   Blood Pressure Cuff Misc 1 Device by Does not apply route once a week.   Comfort Fit Maternity Supp Lg Misc 1 Units daily by Does not apply route.   cyclobenzaprine 10 MG tablet Commonly known as: FLEXERIL Take 1 tablet (10 mg total) by mouth every 8 (eight) hours as needed for muscle spasms.   ibuprofen 600 MG tablet Commonly known as: ADVIL Take 1 tablet (600 mg total) by mouth every 6 (six) hours.   Misc. Devices Misc Dispense one maternity belt for patient   norethindrone 0.35 MG tablet Commonly known  as: MICRONOR Take 1 tablet (0.35 mg total) by mouth daily.   Vitafol Gummies 3.33-0.333-34.8 MG Chew Chew 3 each by mouth as directed. Chew 3 Once a Day   Vitamin D (Ergocalciferol) 1.25 MG (50000 UT) Caps capsule Commonly known as: DRISDOL Take 1 capsule (50,000 Units total) by mouth every 7 (seven) days.       Diet: routine diet  Activity: Advance as tolerated. Pelvic rest for 6 weeks.   Outpatient follow up:4 weeks Follow up Appt: Future Appointments  Date Time Provider Department Center  11/05/2018  1:00 PM Sharyon Cableogers, Veronica C, CNM [facility] None   Follow up Visit:No follow-ups on file.  Postpartum contraception: Progesterone only pills  Newborn Data: Live born female  Birth Weight: 6 lb 10 oz (3005 g) APGAR: 9, 9  Newborn Delivery   Birth date/time: 10/06/2018 10:03:00 Delivery type: Vaginal, Spontaneous      Baby Feeding: Breast Disposition:home with mother   10/07/2018 Joselyn Arrowhelsea N Keisean Skowron, MD

## 2018-10-06 NOTE — Progress Notes (Signed)
Labor Progress Note [name] is a 28 y.o. [id number] at [redacted]w[redacted]d presented for elective IOL. S: Feeling comfortable with Epidural now in place.  O:  BP (!) 115/48   Pulse 80   Temp 98.2 F (36.8 C) (Oral)   Resp 20   Ht 5\' 4"  (1.626 m)   Wt 95.9 kg   LMP 01/02/2018   SpO2 100%   BMI 36.29 kg/m  EFM: 140, reactive, moderate variability, pos accels, no decels Ctx: q2-92m  CVE: Dilation: 3 Effacement (%): 60 Cervical Position: Posterior Station: -2 Presentation: Vertex Exam by:: [place] RN   A&P: 28 y.o. [id number] [redacted]w[redacted]d here for elective IOL. #Labor: Progressing well. Cytotec at [facility]. Pitocin started at 0552. Epidural now placed. RN to check when Foley catheter inserted. Could consider AROM when appropriate. #Pain: Epidural  #FWB: Cat I #GBS negative  [name], MD 9:02 AM

## 2018-10-06 NOTE — Lactation Note (Addendum)
This note was copied from a baby's chart. Lactation Consultation Note  Patient Name: [name] JSHFW'[id number] Date: 10/06/2018 Reason for consult: Term P5, 22 hour female infant. Infant had two void diaper since delivery. Per mom, breastfeeding is going well, infant breastfed 6 times since birth for 20 to 30 minutes each feeding. Mom is experienced at breastfeeding she breastfeed all of her other 4 children for one year, mom has closed spaced pregnancies she has a 52 month old daughter at home.  Mom was given a harmony hand pump by nurse for prn. Mom latched infant on right breast using the cross cradle hold, infant latched wide mouth, tongue down with nose and chin touching breast, swallows observed and infant was still breastfeeding after 10 minutes when LC left the room. [place] reminded mom to keep infant close to breast with chin and nose touching to prevent infant slip off breast and only on nipple. Mom knows to breastfeed infant according hunger cues, 8 to 12 times within 24 hours and on demand. Mom knows to call Nurse or [place] if she has any questions, concerns or need assistance with latching infant to breast. Mom will continue to do STS. Reviewed Baby & Me book's Breastfeeding Basics.  Mom made aware of O/P services, breastfeeding support groups, community resources, and our phone # for post-discharge questions.   Maternal Data Formula Feeding for Exclusion: No Has patient been taught Hand Expression?: Yes(Mom taught back hand expression and colostrum present both breast.) Does the patient have breastfeeding experience prior to this delivery?: Yes  Feeding Feeding Type: Breast Fed  LATCH Score Latch: Grasps breast easily, tongue down, lips flanged, rhythmical sucking.  Audible Swallowing: A few with stimulation  Type of Nipple: Everted at rest and after stimulation  Comfort (Breast/Nipple): Soft / non-tender  Hold (Positioning): Assistance needed to correctly position infant at  breast and maintain latch.  LATCH Score: 8  Interventions Interventions: Breast feeding basics reviewed;Breast compression;Adjust position;Assisted with latch;Skin to skin;Support pillows;Position options;Breast massage;Hand express;Expressed milk;Hand pump  Lactation Tools Discussed/Used WIC Program: Yes Pump Review: Setup, frequency, and cleaning Initiated by:: by Nurse Date initiated:: 10/06/18   Consult Status Consult Status: Follow-up Date: 10/07/18 Follow-up type: In-patient    Vicente Serene 10/06/2018, 9:23 PM

## 2018-10-06 NOTE — Anesthesia Preprocedure Evaluation (Signed)
Anesthesia Evaluation  Patient identified by MRN, date of birth, ID band Patient awake    Reviewed: Allergy & Precautions, H&P , NPO status , Patient's Chart, lab work & pertinent test results  History of Anesthesia Complications Negative for: history of anesthetic complications  Airway Mallampati: II  TM Distance: >3 FB Neck ROM: full    Dental no notable dental hx. (+) Teeth Intact   Pulmonary neg pulmonary ROS,    Pulmonary exam normal breath sounds clear to auscultation       Cardiovascular negative cardio ROS Normal cardiovascular exam Rhythm:regular Rate:Normal     Neuro/Psych negative neurological ROS  negative psych ROS   GI/Hepatic negative GI ROS, Neg liver ROS,   Endo/Other  negative endocrine ROS  Renal/GU negative Renal ROS  negative genitourinary   Musculoskeletal   Abdominal (+) + obese,   Peds  Hematology  (+) Blood dyscrasia, anemia ,   Anesthesia Other Findings   Reproductive/Obstetrics (+) Pregnancy                             Anesthesia Physical Anesthesia Plan  ASA: II  Anesthesia Plan: Epidural   Post-op Pain Management:    Induction:   PONV Risk Score and Plan:   Airway Management Planned:   Additional Equipment:   Intra-op Plan:   Post-operative Plan:   Informed Consent: I have reviewed the patients History and Physical, chart, labs and discussed the procedure including the risks, benefits and alternatives for the proposed anesthesia with the patient or authorized representative who has indicated his/her understanding and acceptance.       Plan Discussed with:   Anesthesia Plan Comments:         Anesthesia Quick Evaluation  

## 2018-10-06 NOTE — H&P (Signed)
OBSTETRIC ADMISSION HISTORY AND PHYSICAL  [name] is a 28 y.o. female [phone] with IUP at [redacted]w[redacted]d by 14wk [place] presenting for elective IOL. Reports intermittent contractions but otherwise denies complaints. She reports +FMs, No LOF, no VB, no blurry vision, headaches or peripheral edema, and RUQ pain.  She plans on breast feeding. She request POPs for birth control. She received her prenatal care at [place]: By early [place] --->  Estimated Date of Delivery: 10/09/18  [facility]ono:  4/21 Anatomy scan  @[redacted]w[redacted]d , CWD, normal anatomy, cephalic presentation, fundal placental lie, 386g, 52% EFW  Prenatal History/Complications:  - Hx of HSV on Valtrex  - PPH with last delivery  - largest baby 7 lbs 2 oz  Past Medical History: Past Medical History:  Diagnosis Date  . Anemia   . Family history of Turner syndrome 2008   w/ first pregnancy    Past Surgical History: Past Surgical History:  Procedure Laterality Date  . NO PAST SURGERIES      Obstetrical History: OB History    Gravida  8   Para  4   Term  4   Preterm  0   AB  2   Living  4     SAB  0   TAB  1   Ectopic  1   Multiple  0   Live Births  4           Social History: Social History   Socioeconomic History  . Marital status: Divorced    Spouse name: Not on file  . Number of children: Not on file  . Years of education: Not on file  . Highest education level: Not on file  Occupational History  . Not on file  Social Needs  . Financial resource strain: Not on file  . Food insecurity    Worry: Not on file    Inability: Not on file  . Transportation needs    Medical: Not on file    Non-medical: Not on file  Tobacco Use  . Smoking status: Never Smoker  . Smokeless tobacco: Never Used  Substance and Sexual Activity  . Alcohol use: No  . Drug use: No  . Sexual activity: Yes    Partners: Male    Birth control/protection: None  Lifestyle  . Physical activity    Days per week: Not on file    Minutes  per session: Not on file  . Stress: Not on file  Relationships  . Social Herbalist on phone: Not on file    Gets together: Not on file    Attends religious service: Not on file    Active member of club or organization: Not on file    Attends meetings of clubs or organizations: Not on file    Relationship status: Not on file  Other Topics Concern  . Not on file  Social History Narrative  . Not on file    Family History: Family History  Problem Relation Age of Onset  . Diabetes Mother   . Hyperlipidemia Mother   . Hypertension Mother     Allergies: Allergies  Allergen Reactions  . Pollen Extract Other (See Comments)    Migraines    Medications Prior to Admission  Medication Sig Dispense Refill Last Dose  . valACYclovir (VALTREX) 500 MG tablet Take 1 tablet (500 mg total) by mouth 2 (two) times daily. 60 tablet 1 Past Week at Unknown time  . acetaminophen (TYLENOL) 500 MG  tablet Take 1,000 mg by mouth every 6 (six) hours as needed for mild pain.     . Blood Pressure Monitoring (BLOOD PRESSURE CUFF) MISC 1 Device by Does not apply route once a week. 1 each 0   . cyclobenzaprine (FLEXERIL) 10 MG tablet Take 1 tablet (10 mg total) by mouth every 8 (eight) hours as needed for muscle spasms. (Patient not taking: Reported on 10/01/2018) 30 tablet 1   . Elastic Bandages & Supports (COMFORT FIT MATERNITY SUPP LG) MISC 1 Units daily by Does not apply route. 1 each 0   . Misc. Devices MISC Dispense one maternity belt for patient 1 Device 0   . Prenatal Vit-Fe Phos-FA-Omega (VITAFOL GUMMIES) 3.33-0.333-34.8 MG CHEW Chew 3 each by mouth as directed. Chew 3 Once a Day 90 tablet 11   . Vitamin D, Ergocalciferol, (DRISDOL) 50000 units CAPS capsule Take 1 capsule (50,000 Units total) by mouth every 7 (seven) days. (Patient not taking: Reported on 09/16/2018) 30 capsule 2      Review of Systems   All systems reviewed and negative except as stated in HPI  Blood pressure 122/71,  pulse 86, resp. rate 16, height 5\' 4"  (1.626 m), weight 95.9 kg, last menstrual period 01/02/2018, unknown if currently breastfeeding. General appearance: alert, cooperative, appears stated age and no distress Lungs: normal effort Heart: regular rate  Abdomen: soft, non-tender; bowel sounds normal Pelvic: gravid uterus  Extremities: Homans sign is negative, no sign of DVT Presentation: cephalic Fetal monitoringBaseline: 145 bpm, Variability: Good {> 6 bpm), Accelerations: Reactive and Decelerations: Absent Uterine activityNone Dilation: 2.5 Effacement (%): Thick Station: -2, -3 Exam by:: [place] McHenry RN   Prenatal labs: ABO, Rh: --/--/PENDING (09/01 0128) Antibody: PENDING (09/01 0128) Rubella: 6.27 (02/13 1329) RPR: Non Reactive (06/16 0908)  HBsAg: Negative (02/13 1329)  HIV: Non Reactive (06/16 0908)  GBS: Negative (08/12 0924)  2 hr Glucola WNL Genetic screening  Low risk Anatomy US WNL  Prenatal Transfer Tool  Maternal Diabetes: No Genetic Screening: Normal Maternal Ultrasounds/Referrals: Normal Fetal Ultrasounds or other Referrals:  None Maternal Substance Abuse:  No Significant Maternal Medications:  None Significant Maternal Lab Results: Group B Strep negative  Results for orders placed or performed during the hospital encounter of 10/06/18 (from the past 24 hour(s))  SARS Coronavirus 2 [facility](Hospital order, Performed in [facility] hospital lab) Nasopharyngeal Nasopharyngeal Swab   Collection Time: 10/06/18 12:30 AM   Specimen: Nasopharyngeal Swab  Result Value Ref Range   SARS Coronavirus 2 NEGATIVE NEGATIVE  CBC   Collection Time: 10/06/18  1:28 AM  Result Value Ref Range   WBC 11.6 (H) 4.0 - 10.5 K/uL   RBC 4.21 3.87 - 5.11 MIL/uL   Hemoglobin 10.4 (L) 12.0 - 15.0 g/dL   HCT 40.933.6 (L) 81.136.0 - 91.446.0 %   MCV 79.8 (L) 80.0 - 100.0 fL   MCH 24.7 (L) 26.0 - 34.0 pg   MCHC 31.0 30.0 - 36.0 g/dL   RDW 78.215.4 95.611.5 - 21.315.5 %   Platelets 337 150 - 400 K/uL   nRBC 0.0  0.0 - 0.2 %  Type and screen   Collection Time: 10/06/18  1:28 AM  Result Value Ref Range   ABO/RH(D) PENDING    Antibody Screen PENDING    Sample Expiration      10/09/2018,2359 Performed at [facility], [address] [place], [place] [address]     Patient Active Problem List   Diagnosis Date Noted  . [redacted] weeks  gestation of pregnancy 10/06/2018  . Family history of Turner syndrome 04/09/2018  . Supervision of other normal pregnancy, antepartum 03/19/2018  . History of herpes labialis 12/31/2016    Assessment/Plan:  Amariyah Lingen is a 28 y.o. [id number] at [redacted]w[redacted]d here for elective IOL.   #Hx of HSV: cont Valtrex #Labor: SVE 2.5/50%/-2. Cytotec placed. Anticipate SVD. Vertex by exam.  #Pain: IV pain meds PRN or Epidural when desires #FWB: Cat I; EFW: 3600g #ID:  GBS neg #MOF: breast #MOC: POPs  Jerilynn Birkenhead, MD [facility] Family [facility] Fellow, [facility] for [facility], [facility] 10/06/2018, 2:23 AM

## 2018-10-06 NOTE — Progress Notes (Signed)
Labor Progress Note Rajah Lamba is a 28 y.o. [id number] at [redacted]w[redacted]d presented for elective IOL. S: Patient mostly comfortable and talking through contractions.   O:  BP (!) 116/51   Pulse 80   Temp 98.5 F (36.9 C) (Oral)   Resp 17   Ht 5\' 4"  (1.626 m)   Wt 95.9 kg   LMP 01/02/2018   BMI 36.29 kg/m  EFM: 130, reactive, moderate variability, pos accels, no decels Ctx: q13m  CVE: Dilation: 3 Effacement (%): 60 Cervical Position: Posterior Station: -2 Presentation: Vertex Exam by:: [place] McHenry RN   A&P: 28 y.o. [id number] [redacted]w[redacted]d here for elective IOL. #Labor: Progressing well. Cytotec at [place]. Pitocin started at 0552.  #Pain: IV meds PRN. Epidural should patient desire (prior deliveries with no epidural) #FWB: Cat I #GBS negative  [name], MD 6:31 AM

## 2018-10-06 NOTE — Anesthesia Procedure Notes (Signed)
Epidural Patient location during procedure: OB Start time: 10/06/2018 8:33 AM End time: 10/06/2018 8:43 AM  Staffing Anesthesiologist: Murvin Natal, MD Performed: anesthesiologist   Preanesthetic Checklist Completed: patient identified, site marked, pre-op evaluation, timeout performed, IV checked, risks and benefits discussed and monitors and equipment checked  Epidural Patient position: sitting Prep: DuraPrep Patient monitoring: heart rate, cardiac monitor, continuous pulse ox and blood pressure Approach: midline Location: L4-L5 Injection technique: LOR air  Needle:  Needle type: Tuohy  Needle gauge: 17 G Needle length: 9 cm Needle insertion depth: 6 cm Catheter type: closed end flexible Catheter size: 19 Gauge Catheter at skin depth: 11 cm Test dose: negative and Other  Assessment Events: blood not aspirated, injection not painful, no injection resistance and negative IV test  Additional Notes Informed consent obtained prior to proceeding including risk of failure, 1% risk of PDPH, risk of minor discomfort and bruising. Discussed alternatives to epidural analgesia and patient desires to proceed.  Timeout performed pre-procedure verifying patient name, procedure, and platelet count.  Patient tolerated procedure well. Reason for block:procedure for pain

## 2018-10-07 LAB — CBC
HCT: 30.8 % — ABNORMAL LOW (ref 36.0–46.0)
Hemoglobin: 9.9 g/dL — ABNORMAL LOW (ref 12.0–15.0)
MCH: 25.3 pg — ABNORMAL LOW (ref 26.0–34.0)
MCHC: 32.1 g/dL (ref 30.0–36.0)
MCV: 78.6 fL — ABNORMAL LOW (ref 80.0–100.0)
Platelets: 294 10*3/uL (ref 150–400)
RBC: 3.92 MIL/uL (ref 3.87–5.11)
RDW: 15.3 % (ref 11.5–15.5)
WBC: 11.7 10*3/uL — ABNORMAL HIGH (ref 4.0–10.5)
nRBC: 0 % (ref 0.0–0.2)

## 2018-10-07 MED ORDER — OXYCODONE HCL 5 MG PO TABS
5.0000 mg | ORAL_TABLET | Freq: Once | ORAL | Status: AC
  Administered 2018-10-07: 5 mg via ORAL
  Filled 2018-10-07: qty 1

## 2018-10-07 MED ORDER — NORETHINDRONE 0.35 MG PO TABS: 1.0000 | ORAL_TABLET | Freq: Every day | ORAL | 11 refills | Status: DC

## 2018-10-07 MED ORDER — ACETAMINOPHEN 325 MG PO TABS: 650.0000 mg | ORAL_TABLET | ORAL | 1 refills | Status: AC | PRN

## 2018-10-07 MED ORDER — OXYCODONE HCL 5 MG PO TABS: 5.0000 mg | ORAL_TABLET | Freq: Once | ORAL | Status: DC

## 2018-10-07 MED ORDER — IBUPROFEN 600 MG PO TABS: 600.0000 mg | ORAL_TABLET | Freq: Four times a day (QID) | ORAL | 0 refills | Status: DC

## 2018-10-07 MED ORDER — OXYCODONE HCL 5 MG PO TABS: 5.0000 mg | ORAL_TABLET | ORAL | Status: DC

## 2018-10-07 NOTE — Lactation Note (Signed)
This note was copied from a baby's chart. Lactation Consultation Note  Patient Name: [name] [id number] Date: 10/07/2018 Reason for consult: Follow-up assessment   P5, Baby 31 hours old.  Mother denies questions or concerns. Feed on demand with cues.  Goal 8-12+ times per day after first 24 hrs.  Place baby STS if not cueing.  Reviewed engorgement care and monitoring voids/stools.    Maternal Data    Feeding Feeding Type: Breast Fed  LATCH Score                   Interventions Interventions: Breast feeding basics reviewed  Lactation Tools Discussed/Used     Consult Status Consult Status: Complete Date: 10/07/18    [name] [facility] 10/07/2018, 12:04 PM

## 2018-10-07 NOTE — Anesthesia Postprocedure Evaluation (Signed)
Anesthesia Post Note  Patient: [name]  Procedure(s) Performed: AN AD HOC LABOR EPIDURAL     Patient location during evaluation: Mother Baby Anesthesia Type: Epidural Level of consciousness: awake Pain management: pain level controlled Vital Signs Assessment: post-procedure vital signs reviewed and stable Respiratory status: spontaneous breathing Cardiovascular status: stable Postop Assessment: patient able to bend at knees, epidural receding and no headache Anesthetic complications: no Comments: Slight backache at insertion site    Last Vitals:  Vitals:   10/06/18 2315 10/07/18 0509  BP: (!) 103/57 127/67  Pulse: 76 63  Resp: 18 18  Temp: 36.8 C 36.6 C  SpO2: 99% 100%    Last Pain:  Vitals:   10/07/18 0805  TempSrc:   PainSc: 7    Pain Goal: Patients Stated Pain Goal: 4 (10/07/18 0805)              Epidural/Spinal Function Cutaneous sensation: Normal sensation (10/07/18 0805), Patient able to flex knees: Yes (10/07/18 0805), Patient able to lift hips off bed: Yes (10/07/18 0805), Back pain beyond tenderness at insertion site: No (10/07/18 0805), Progressively worsening motor and/or sensory loss: No (10/07/18 0805), Bowel and/or bladder incontinence post epidural: No (10/07/18 0805)  Everette Rank

## 2018-11-05 ENCOUNTER — Ambulatory Visit (INDEPENDENT_AMBULATORY_CARE_PROVIDER_SITE_OTHER): Payer: Medicaid Other | Admitting: Certified Nurse Midwife

## 2018-11-05 ENCOUNTER — Other Ambulatory Visit: Payer: Self-pay

## 2018-11-05 DIAGNOSIS — M6208 Separation of muscle (nontraumatic), other site: Secondary | ICD-10-CM

## 2018-11-05 DIAGNOSIS — Z1389 Encounter for screening for other disorder: Secondary | ICD-10-CM

## 2018-11-05 NOTE — Patient Instructions (Signed)
Diastasis Recti  Diastasis recti is when the muscles of the abdomen (rectus abdominis muscles) become thin and separate. The result is a wider space between the right and left abdomen (abdominal) muscles. This wider space between the muscles may cause a bulge in the middle of your abdomen. You may notice this bulge when you are straining or when you sit up from a lying down position. Diastasis recti can affect men and women. It is most common among pregnant women, infants, people who are obese, and people who have had abdominal surgery. Exercise or surgical treatment may help correct it. What are the causes? Common causes of this condition include:  Pregnancy. The growing uterus puts pressure on the abdominal muscles, which causes the muscles to separate.  Obesity. Excess fat puts pressure on abdominal muscles.  Weightlifting.  Some abdomen exercises.  Advanced age.  Genetics.  Prior abdominal surgery. What increases the risk? This condition is more likely to develop in:  Women.  Newborns, especially newborns who are born early (prematurely). What are the signs or symptoms? Common symptoms of this condition include:  A bulge in the middle of the abdomen. You will notice it most when you sit up or strain.  Pain in the low back, pelvis, or hips.  Constipation.  Inability to control when you urinate (urinary incontinence).  Bloating.  Poor posture. How is this diagnosed? This condition is diagnosed with a physical exam. Your health care provider will ask you to lie flat on your back and do a crunch or half sit-up. If you have diastasis recti, a vertical bulge will appear between your abdominal muscles in the center of your abdomen. Your health care provider will measure the gap between your muscles with one of the following:  A medical device used to measure the space between two objects (caliper).  A tape measure.  CT scan.  Ultrasound.  Finger spaces. Your health  care provider will measure the space using their fingers. How is this treated? If your muscle separation is not too large, you may not need treatment. However, if you are a woman who plans to become pregnant again, you should treat this condition before your next pregnancy. Treatment may include:  Physical therapy to strengthen and tighten your abdominal muscles.  Lifestyle changes such as weight loss and exercise.  Over-the-counter pain medicines as needed.  Surgery to correct the separation. Follow these instructions at home: Activity  Return to your normal activities as told by your health care provider. Ask your health care provider what activities are safe for you.  When lifting weights or doing exercises using your abdominal muscles or the muscles in the center of your body that give stability (core muscles), make sure you are doing your exercises and movements correctly. Proper form can help to prevent the condition from happening again. General instructions  If you are overweight, ask your health care provider for help with weight loss. Losing even a small amount of weight can help to improve your diastasis recti.  Take over-the-counter or prescription medicines only as told by your health care provider.  Do not strain. Straining can make the separation worse. Examples of straining include: ? Pushing hard to have a bowel movement, such as due to constipation. ? Lifting heavy objects, including children. ? Standing up and sitting down.  Take steps to prevent constipation: ? Drink enough fluid to keep your urine clear or pale yellow. ? Take over-the-counter or prescription medicines only as directed. ? Eat foods   that are high in fiber, such as fresh fruits and vegetables, whole grains, and beans. ? Limit foods that are high in fat and processed sugars, such as fried and sweet foods. Contact a health care provider if:  You notice a new bulge in your abdomen. Get help right  away if:  You experience severe discomfort in your abdomen.  You develop severe abdominal pain along with nausea, vomiting, or fever. Summary  Diastasis recti is when the abdomen (abdominal) muscles become thin and separate. Your abdomen will stick out because the space between your right and left abdomen muscles has widened.  The most common symptom is a bulge in your abdomen. You will notice it most when you sit up or are straining.  This condition is diagnosed during a physical exam.  If the abdomen separation is not too big, you may choose not to have treatment. Otherwise, you may need to undergo physical therapy or surgery. This information is not intended to replace advice given to you by your health care provider. Make sure you discuss any questions you have with your health care provider. Document Released: 03/18/2016 Document Revised: 01/03/2017 Document Reviewed: 03/18/2016 Elsevier Patient Education  2020 Elsevier Inc.  

## 2018-11-05 NOTE — Progress Notes (Signed)
Post Partum Exam  [name] is a 28 y.o. [phone] female who presents for a postpartum visit. She is 4 weeks postpartum following a spontaneous vaginal delivery. I have fully reviewed the prenatal and intrapartum course. The delivery was at 39.4 gestational weeks.  Anesthesia: epidural. Postpartum course has been uncomplicated. Baby's course has been uncomplicated. Baby is feeding by breast. Bleeding no bleeding. Bowel function is normal. Bladder function is normal. Patient is not sexually active. Contraception method is oral progesterone-only contraceptive. Postpartum depression screening:neg, score 0.  The following portions of the patient's history were reviewed and updated as appropriate: allergies, current medications, past surgical history and problem list. Last pap smear done 03/2018 and was Normal  Review of Systems A comprehensive review of systems was negative.    Objective:  unknown if currently breastfeeding.  General:  alert, cooperative and no distress   Breasts:  inspection negative, no nipple discharge or bleeding, no masses or nodularity palpable  Lungs: clear to auscultation bilaterally  Heart:  regular rate and rhythm  Abdomen: normal findings: no masses palpable, soft, non-tender and umbilicus normal and abnormal findings:  diastasis recti   Vulva:  not evaluated  Vagina: not evaluated  Cervix:  not evaluated  Corpus: not examined  Adnexa:  not evaluated  Rectal Exam: Not performed.        Assessment/Plan:  1. Postpartum care and examination - Normal postpartum exam. Pap smear not done at today's visit.  - Instructed patient to start taking birth control - Clearance note for patient to return to work given to patient   2. Diastasis of rectus abdominis - Educated and discussed exercises that will help to resolve diastasis  - Instructed patient to ease back into exercise and not rush- patient verbalizes understanding  Contraception: oral progesterone-only  contraceptive Follow up in: 1 year for annual or as needed.   Lajean Manes, CNM 11/05/18, 3:41 PM

## 2018-11-30 ENCOUNTER — Ambulatory Visit: Payer: Medicaid Other

## 2019-03-16 ENCOUNTER — Telehealth: Payer: Self-pay | Admitting: *Deleted

## 2019-03-16 NOTE — Telephone Encounter (Signed)
Pt called to office stating she needs a letter stating she cannot get Covid vaccine due to her currently breastfeeding. 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   I made her aware I will review with provider as I do no know if there are any exemptions for vaccine at this time.   Please advise on vaccine and breastfeeding patients.  Is it ok to give pt letter stating she should not get vaccine?

## 2019-03-18 ENCOUNTER — Telehealth: Payer: Self-pay | Admitting: *Deleted

## 2019-03-18 ENCOUNTER — Emergency Department (HOSPITAL_COMMUNITY)
Admission: EM | Admit: 2019-03-18 | Discharge: 2019-03-18 | Disposition: A | Discharge status: Home / Self Care | Payer: Medicaid Other | Attending: Emergency Medicine | Admitting: Emergency Medicine

## 2019-03-18 ENCOUNTER — Encounter (HOSPITAL_COMMUNITY): Payer: Self-pay | Admitting: Emergency Medicine

## 2019-03-18 DIAGNOSIS — Z79899 Other long term (current) drug therapy: Secondary | ICD-10-CM | POA: Insufficient documentation

## 2019-03-18 DIAGNOSIS — H209 Unspecified iridocyclitis: Secondary | ICD-10-CM | POA: Insufficient documentation

## 2019-03-18 DIAGNOSIS — H5711 Ocular pain, right eye: Secondary | ICD-10-CM

## 2019-03-18 MED ORDER — FLUORESCEIN SODIUM 1 MG OP STRP
1.0000 | ORAL_STRIP | Freq: Once | OPHTHALMIC | Status: DC
  Filled 2019-03-18: qty 1

## 2019-03-18 MED ORDER — FLUORESCEIN SODIUM 1 MG OP STRP
ORAL_STRIP | OPHTHALMIC | Status: AC
  Filled 2019-03-18: qty 2

## 2019-03-18 MED ORDER — TETRACAINE HCL 0.5 % OP SOLN
2.0000 [drp] | Freq: Once | OPHTHALMIC | Status: AC
  Administered 2019-03-18: 2 [drp] via OPHTHALMIC
  Filled 2019-03-18: qty 4

## 2019-03-18 NOTE — ED Provider Notes (Signed)
[facility] DEPARTMENT Provider Note   CSN: [id number] Arrival date & time: 03/18/19  0454     History Chief Complaint  Patient presents with  . Eye Pain    [name] is a 29 y.o. female.  The history is provided by the patient.  Eye Pain This is a new problem. The current episode started 12 to 24 hours ago. The problem occurs constantly. The problem has been gradually worsening. Pertinent negatives include no chest pain and no headaches. Exacerbated by: Sunday Corn. Nothing relieves the symptoms.  Patient presents with atraumatic right eye pain.  She reports over the past 12 hours she has had right eye pain, with significant photophobia.  Denies visual loss.  No fevers vomiting or headache.  She does not wear contact lenses.  No history of eye surgery.  She has no other known medical conditions     Past Medical History:  Diagnosis Date  . Anemia   . Family history of Turner syndrome 2008   w/ first pregnancy    Patient Active Problem List   Diagnosis Date Noted  . [redacted] weeks gestation of pregnancy 10/06/2018  . Family history of Turner syndrome 04/09/2018  . Supervision of other normal pregnancy, antepartum 03/19/2018  . History of herpes labialis 12/31/2016    Past Surgical History:  Procedure Laterality Date  . NO PAST SURGERIES       OB History    Gravida  7   Para  5   Term  5   Preterm  0   AB  2   Living  5     SAB  0   TAB  1   Ectopic  1   Multiple  0   Live Births  5           Family History  Problem Relation Age of Onset  . Diabetes Mother   . Hyperlipidemia Mother   . Hypertension Mother     Social History   Tobacco Use  . Smoking status: Never Smoker  . Smokeless tobacco: Never Used  Substance Use Topics  . Alcohol use: No  . Drug use: No    Home Medications Prior to Admission medications   Medication Sig Start Date End Date Taking? Authorizing Provider  acetaminophen (TYLENOL) 325 MG tablet  Take 2 tablets (650 mg total) by mouth every 4 (four) hours as needed (for pain scale < 4). 10/07/18   Fair, Marin Shutter, MD  acetaminophen (TYLENOL) 500 MG tablet Take 1,000 mg by mouth every 6 (six) hours as needed for mild pain.    [provider]  Blood Pressure Monitoring (BLOOD PRESSURE CUFF) MISC 1 Device by Does not apply route once a week. 05/25/18   Constant, Peggy, MD  cyclobenzaprine (FLEXERIL) 10 MG tablet Take 1 tablet (10 mg total) by mouth every 8 (eight) hours as needed for muscle spasms. 09/09/18   Shelly Bombard, MD  Elastic Bandages & Supports (COMFORT FIT MATERNITY SUPP LG) MISC 1 Units daily by Does not apply route. 12/12/16   Kandis Cocking A, CNM  ibuprofen (ADVIL) 600 MG tablet Take 1 tablet (600 mg total) by mouth every 6 (six) hours. 10/07/18   [name], MD  Misc. Devices MISC Dispense one maternity belt for patient 08/25/18   Shelly Bombard, MD  norethindrone (MICRONOR) 0.35 MG tablet Take 1 tablet (0.35 mg total) by mouth daily. 10/07/18   FairMarin Shutter, MD  Prenatal Vit-Fe Phos-FA-Omega (VITAFOL  GUMMIES) 3.33-0.333-34.8 MG CHEW Chew 3 each by mouth as directed. Chew 3 Once a Day 05/01/18   Brock Bad, MD  Vitamin D, Ergocalciferol, (DRISDOL) 50000 units CAPS capsule Take 1 capsule (50,000 Units total) by mouth every 7 (seven) days. Patient not taking: Reported on 09/16/2018 07/10/16   Orvilla Cornwall A, CNM    Allergies    Pollen extract  Review of Systems   Review of Systems  Constitutional: Negative for fever.  Eyes: Positive for photophobia, pain and redness. Negative for visual disturbance.  Cardiovascular: Negative for chest pain.  Gastrointestinal: Negative for vomiting.  Neurological: Negative for headaches.    Physical Exam Updated Vital Signs BP 114/60 (BP Location: Left Arm)   Pulse 70   Temp 98.3 F (36.8 C) (Oral)   Resp 17   Ht 1.626 m (5\' 4" )   Wt 86.2 kg   SpO2 100%   BMI 32.61 kg/m   Physical Exam CONSTITUTIONAL:  Well developed/well nourished HEAD: Normocephalic/atraumatic EYES: EOMI/PERRL, conjunctival injection on the right. +Consensual pain on the right.  No corneal abrasion.  No foreign bodies No corneal hazing. IOP~15 No obvious cell or flare but limited exam.  Visual acuity noted ENMT: Mucous membranes moist NECK: supple no meningeal signs SPINE/BACK:entire spine nontender CV: S1/S2 noted LUNGS: Lungs are clear to auscultation bilaterally, no apparent distress ABDOMEN: soft NEURO: Pt is awake/alert/appropriate, moves all extremitiesx4.  No facial droop.   EXTREMITIES: full ROM SKIN: warm, color normal PSYCH: no abnormalities of mood noted, alert and oriented to situation  ED Results / Procedures / Treatments   Labs (all labs ordered are listed, but only abnormal results are displayed) Labs Reviewed - No data to display  EKG None  Radiology No results found.  Procedures Procedures  Medications Ordered in ED Medications  tetracaine (PONTOCAINE) 0.5 % ophthalmic solution 2 drop (2 drops Right Eye Given 03/18/19 0537)  fluorescein 1 MG ophthalmic strip (  Given 03/18/19 05/16/19)    ED Course  I have reviewed the triage vital signs and the nursing notes.      MDM Rules/Calculators/A&P                      Patient with iritis of unclear etiology.  Will consult ophthalmology 7:00 AM Discussed with Dr. [id number] with ophthalmology. He will see the patient in 1 hour in his office. He does not want any medications to be given Final Clinical Impression(s) / ED Diagnoses Final diagnoses:  Pain of right eye  Iritis    Rx / DC Orders ED Discharge Orders    None       Ranae Pila, MD 03/18/19 0700

## 2019-03-18 NOTE — Telephone Encounter (Signed)
Pt made aware of no letter that can be sent at this time.

## 2019-03-18 NOTE — Telephone Encounter (Signed)
-----   Message from Sharyon Cable, CNM sent at 03/18/2019  8:36 AM EST ----- Can you talk to one of the MDs in the office about this?   I do not believe there are any letters stating the patient can not get the vaccine because of breastfeeding but I am not 100% sure   Thank you!  Suzette Battiest

## 2019-03-18 NOTE — ED Triage Notes (Signed)
Pt in with R eye pain since yesterday. States pain worse with light and when opening/closing eye. Denies any vision changes

## 2019-09-27 ENCOUNTER — Other Ambulatory Visit: Payer: Self-pay

## 2019-09-27 NOTE — ED Notes (Signed)
Pt did not respond when called to get triage vitals

## 2019-09-28 ENCOUNTER — Emergency Department (HOSPITAL_COMMUNITY)
Admission: EM | Admit: 2019-09-28 | Discharge: 2019-09-28 | Discharge status: Against Medical Advice | Payer: Medicaid Other

## 2019-09-28 NOTE — ED Notes (Signed)
Pt called no response

## 2020-05-12 ENCOUNTER — Encounter (HOSPITAL_COMMUNITY): Payer: Self-pay | Admitting: *Deleted

## 2020-05-12 ENCOUNTER — Other Ambulatory Visit: Payer: Self-pay

## 2020-05-12 ENCOUNTER — Inpatient Hospital Stay (HOSPITAL_COMMUNITY): Payer: Medicaid Other

## 2020-05-12 ENCOUNTER — Inpatient Hospital Stay (HOSPITAL_COMMUNITY)
Admission: AD | Admit: 2020-05-12 | Discharge: 2020-05-12 | Disposition: A | Discharge status: Home / Self Care | Payer: Medicaid Other | Attending: Obstetrics & Gynecology | Admitting: Obstetrics & Gynecology

## 2020-05-12 DIAGNOSIS — O209 Hemorrhage in early pregnancy, unspecified: Secondary | ICD-10-CM | POA: Diagnosis not present

## 2020-05-12 DIAGNOSIS — R109 Unspecified abdominal pain: Secondary | ICD-10-CM | POA: Diagnosis not present

## 2020-05-12 DIAGNOSIS — M549 Dorsalgia, unspecified: Secondary | ICD-10-CM

## 2020-05-12 DIAGNOSIS — O26891 Other specified pregnancy related conditions, first trimester: Secondary | ICD-10-CM | POA: Diagnosis not present

## 2020-05-12 DIAGNOSIS — Z3A01 Less than 8 weeks gestation of pregnancy: Secondary | ICD-10-CM

## 2020-05-12 DIAGNOSIS — O99891 Other specified diseases and conditions complicating pregnancy: Secondary | ICD-10-CM

## 2020-05-12 DIAGNOSIS — O3680X0 Pregnancy with inconclusive fetal viability, not applicable or unspecified: Secondary | ICD-10-CM

## 2020-05-12 DIAGNOSIS — Z3490 Encounter for supervision of normal pregnancy, unspecified, unspecified trimester: Secondary | ICD-10-CM

## 2020-05-12 LAB — URINALYSIS, ROUTINE W REFLEX MICROSCOPIC
Bacteria, UA: NONE SEEN
Bilirubin Urine: NEGATIVE
Glucose, UA: NEGATIVE mg/dL
Ketones, ur: NEGATIVE mg/dL
Leukocytes,Ua: NEGATIVE
Nitrite: NEGATIVE
Protein, ur: NEGATIVE mg/dL
Specific Gravity, Urine: 1.024 (ref 1.005–1.030)
pH: 5 (ref 5.0–8.0)

## 2020-05-12 LAB — COMPREHENSIVE METABOLIC PANEL
ALT: 16 U/L (ref 0–44)
AST: 17 U/L (ref 15–41)
Albumin: 3.7 g/dL (ref 3.5–5.0)
Alkaline Phosphatase: 38 U/L (ref 38–126)
Anion gap: 6 (ref 5–15)
BUN: 16 mg/dL (ref 6–20)
CO2: 25 mmol/L (ref 22–32)
Calcium: 9.4 mg/dL (ref 8.9–10.3)
Chloride: 104 mmol/L (ref 98–111)
Creatinine, Ser: 0.65 mg/dL (ref 0.44–1.00)
GFR, Estimated: >60 mL/min (ref >60)
Glucose, Bld: 89 mg/dL (ref 70–99)
Potassium: 3.5 mmol/L (ref 3.5–5.1)
Sodium: 135 mmol/L (ref 135–145)
Total Bilirubin: 0.4 mg/dL (ref 0.3–1.2)
Total Protein: 7.1 g/dL (ref 6.5–8.1)

## 2020-05-12 LAB — CBC
HCT: 35.8 % — ABNORMAL LOW (ref 36.0–46.0)
Hemoglobin: 11.4 g/dL — ABNORMAL LOW (ref 12.0–15.0)
MCH: 25.5 pg — ABNORMAL LOW (ref 26.0–34.0)
MCHC: 31.8 g/dL (ref 30.0–36.0)
MCV: 80.1 fL (ref 80.0–100.0)
Platelets: 316 10*3/uL (ref 150–400)
RBC: 4.47 MIL/uL (ref 3.87–5.11)
RDW: 13.6 % (ref 11.5–15.5)
WBC: 10.4 10*3/uL (ref 4.0–10.5)
nRBC: 0 % (ref 0.0–0.2)

## 2020-05-12 LAB — WET PREP, GENITAL
Clue Cells Wet Prep HPF POC: NONE SEEN
Sperm: NONE SEEN
Trich, Wet Prep: NONE SEEN
Yeast Wet Prep HPF POC: NONE SEEN

## 2020-05-12 LAB — HCG, QUANTITATIVE, PREGNANCY: hCG, Beta Chain, Quant, S: 77756 m[IU]/mL — ABNORMAL HIGH (ref <5)

## 2020-05-12 LAB — POCT PREGNANCY, URINE: Preg Test, Ur: POSITIVE — AB

## 2020-05-12 NOTE — Discharge Instructions (Signed)
Safe Medications in Pregnancy   Acne: Benzoyl Peroxide Salicylic Acid  Backache/Headache: Tylenol: 2 regular strength every 4 hours OR              2 Extra strength every 6 hours  Colds/Coughs/Allergies: Benadryl (alcohol free) 25 mg every 6 hours as needed Breath right strips Claritin Cepacol throat lozenges Chloraseptic throat spray Cold-Eeze- up to three times per day Cough drops, alcohol free Flonase (by prescription only) Guaifenesin Mucinex Robitussin DM (plain only, alcohol free) Saline nasal spray/drops Sudafed (pseudoephedrine) & Actifed ** use only after [redacted] weeks gestation and if you do not have high blood pressure Tylenol Vicks Vaporub Zinc lozenges Zyrtec   Constipation: Colace Ducolax suppositories Fleet enema Glycerin suppositories Metamucil Milk of magnesia Miralax Senokot Smooth move tea  Diarrhea: Kaopectate Imodium A-D  *NO pepto Bismol  Hemorrhoids: Anusol Anusol HC Preparation H Tucks  Indigestion: Tums Maalox Mylanta Zantac  Pepcid  Insomnia: Benadryl (alcohol free) 25mg  every 6 hours as needed Tylenol PM Unisom, no Gelcaps  Leg Cramps: Tums MagGel  Nausea/Vomiting:  Bonine Dramamine Emetrol Ginger extract Sea bands Meclizine  Nausea medication to take during pregnancy:  Unisom (doxylamine succinate 25 mg tablets) Take one tablet daily at bedtime. If symptoms are not adequately controlled, the dose can be increased to a maximum recommended dose of two tablets daily (1/2 tablet in the morning, 1/2 tablet mid-afternoon and one at bedtime). Vitamin B6 100mg  tablets. Take one tablet twice a day (up to 200 mg per day).  Skin Rashes: Aveeno products Benadryl cream or 25mg  every 6 hours as needed Calamine Lotion 1% cortisone cream  Yeast infection: Gyne-lotrimin 7 Monistat 7   **If taking multiple medications, please check labels to avoid duplicating the same active ingredients **take medication as directed on  the label ** Do not exceed 4000 mg of tylenol in 24 hours **Do not take medications that contain aspirin or ibuprofen     Prenatal Care Prenatal care is health care during pregnancy. It helps you and your unborn baby (fetus) stay as healthy as possible. Prenatal care may be provided by a midwife, a family practice doctor, a (nurse practitioner or physician assistant), or a childbirth and pregnancy doctor (obstetrician). How does this affect me? During pregnancy, you will be closely monitored for any new conditions that might develop. To lower your risk of pregnancy complications, you and your health care provider will talk about any underlying conditions you have. How does this affect my baby? Early and consistent prenatal care increases the chance that your baby will be healthy during pregnancy. Prenatal care lowers the risk that your baby will be:  Born early (prematurely).  Smaller than expected at birth (small for gestational age). What can I expect at the first prenatal care visit? Your first prenatal care visit will likely be the longest. You should schedule your first prenatal care visit as soon as you know that you are pregnant. Your first visit is a good time to talk about any questions or concerns you have about pregnancy. Medical history At your visit, you and your health care provider will talk about your medical history, including:  Any past pregnancies.  Your family's medical history.  Medical history of the baby's father.  Any long-term (chronic) health conditions you have and how you manage them.  Any surgeries or procedures you have had.  Any current over-the-counter or prescription medicines, herbs, or supplements that you are taking.  Other factors that could pose a risk to  your baby, including: ? Exposure to harmful chemicals or radiation at work or at home. ? Any substance use, including tobacco, alcohol, and drug use.  Your home  setting and your stress levels, including: ? Exposure to abuse or violence. ? Household financial strain.  Your daily health habits, including diet and exercise. Tests and screenings Your health care provider will:  Measure your weight, height, and blood pressure.  Do a physical exam, including a pelvic and breast exam.  Perform blood tests and urine tests to check for: ? Urinary tract infection. ? Sexually transmitted infections (STIs). ? Low iron levels in your blood (anemia). ? Blood type and certain proteins on red blood cells (Rh antibodies). ? Infections and immunity to viruses, such as hepatitis B and rubella. ? HIV (human immunodeficiency virus).  Discuss your options for genetic screening. Tips about staying healthy Your health care provider will also give you information about how to keep yourself and your baby healthy, including:  Nutrition and taking vitamins.  Physical activity.  How to manage pregnancy symptoms such as nausea and vomiting (morning sickness).  Infections and substances that may be harmful to your baby and how to avoid them.  Food safety.  Dental care.  Working.  Travel.  Warning signs to watch for and when to call your health care provider. How often will I have prenatal care visits? After your first prenatal care visit, you will have regular visits throughout your pregnancy. The visit schedule is often as follows:  Up to week 28 of pregnancy: once every 4 weeks.  28-36 weeks: once every 2 weeks.  After 36 weeks: every week until delivery. Some women may have visits more or less often depending on any underlying health conditions and the health of the baby. Keep all follow-up and prenatal care visits. This is important. What happens during routine prenatal care visits? Your health care provider will:  Measure your weight and blood pressure.  Check for fetal heart sounds.  Measure the height of your uterus in your abdomen (fundal  height). This may be measured starting around week 20 of pregnancy.  Check the position of your baby inside your uterus.  Ask questions about your diet, sleeping patterns, and whether you can feel the baby move.  Review warning signs to watch for and signs of labor.  Ask about any pregnancy symptoms you are having and how you are dealing with them. Symptoms may include: ? Headaches. ? Nausea and vomiting. ? Vaginal discharge. ? Swelling. ? Fatigue. ? Constipation. ? Changes in your vision. ? Feeling persistently sad or anxious. ? Any discomfort, including back or pelvic pain. ? Bleeding or spotting. Make a list of questions to ask your health care provider at your routine visits.   What tests might I have during prenatal care visits? You may have blood, urine, and imaging tests throughout your pregnancy, such as:  Urine tests to check for glucose, protein, or signs of infection.  Glucose tests to check for a form of diabetes that can develop during pregnancy (gestational diabetes mellitus). This is usually done around week 24 of pregnancy.  Ultrasounds to check your baby's growth and development, to check for birth defects, and to check your baby's well-being. These can also help to decide when you should deliver your baby.  A test to check for group B strep (GBS) infection. This is usually done around week 36 of pregnancy.  Genetic testing. This may include blood, fluid, or tissue sampling, or imaging tests,  such as an ultrasound. Some genetic tests are done during the first trimester and some are done during the second trimester. What else can I expect during prenatal care visits? Your health care provider may recommend getting certain vaccines during pregnancy. These may include:  A yearly flu shot (annual influenza vaccine). This is especially important if you will be pregnant during flu season.  Tdap (tetanus, diphtheria, pertussis) vaccine. Getting this vaccine during  pregnancy can protect your baby from whooping cough (pertussis) after birth. This vaccine may be recommended between weeks 27 and 36 of pregnancy.  A COVID-19 vaccine. Later in your pregnancy, your health care provider may give you information about:  Childbirth and breastfeeding classes.  Choosing a health care provider for your baby.  Umbilical cord banking.  Breastfeeding.  Birth control after your baby is born.  The hospital labor and delivery unit and how to set up a tour.  Registering at the hospital before you go into labor. Where to find more information  Office on Women's Health: TravelLesson.ca  American Pregnancy Association: americanpregnancy.org  March of Dimes: marchofdimes.org Summary  Prenatal care helps you and your baby stay as healthy as possible during pregnancy.  Your first prenatal care visit will most likely be the longest.  You will have visits and tests throughout your pregnancy to monitor your health and your baby's health.  Bring a list of questions to your visits to ask your health care provider.  Make sure to keep all follow-up and prenatal care visits. This information is not intended to replace advice given to you by your health care provider. Make sure you discuss any questions you have with your health care provider. Document Revised: 11/04/2019 Document Reviewed: 11/04/2019 Elsevier Patient Education  2021 [employer].

## 2020-05-12 NOTE — MAU Note (Signed)
PT SAYS LMP WAS 03-18-20. SAYS SHE HAS CRAMPING  AND BACK PAIN - STARTED LAST SAT. TOOK XS TYLENOL 1 TAB  AT NOON - NO RELIEF. ALSO FEELS PAIN BEHIND UMBILICUS - STABBING - STARTED ON WED .  DID HPT ON  2 WEEKS AGO- POSITIVE . HAD CONFIRMED AT  [facility] .  HAS BROWN SPOTTING WHEN SHE WIPES - STARTED LAST SAT.

## 2020-05-12 NOTE — MAU Provider Note (Signed)
History     CSN: [id number]  Arrival date and time: 05/12/20 1905   Event Date/Time   First Provider Initiated Contact with Patient 05/12/20 2055      Chief Complaint  Patient presents with  . Abdominal Pain  . Back Pain  . Vaginal Bleeding   HPI [name] is a 30 y.o. [id number] at [redacted]w[redacted]d by certain LMP who presents to MAU with chief complaint of abdominal cramping. This is a new problem, onset about one week ago. Patient's primary pain is at her umbilicus and does not radiate. She endorses additional pain bilaterally at her groin. That pain does not radiate. She denies aggravating or alleviating factors. She has not taken medication or tried other treatments.  Patient also c/o brown vaginal spotting, onset coinciding with the onset of abdominal pain last week. She only sees the spotting when she wipes after voiding. She denies heavy bleeding. She is not seeing blood on her underwear or pants. She has not needed to don a pad.    Patient denies all other complaints including abdominal tenderness, dysuria, abnormal vaginal discharge, fever or recent illness. She is remote from sexual intercourse.  Patient has previously received care with [facility].   OB History    Gravida  8   Para  5   Term  5   Preterm  0   AB  2   Living  5     SAB  0   IAB  1   Ectopic  1   Multiple  0   Live Births  5           Past Medical History:  Diagnosis Date  . Anemia   . Family history of Turner syndrome 2008   w/ first pregnancy    Past Surgical History:  Procedure Laterality Date  . NO PAST SURGERIES      Family History  Problem Relation Age of Onset  . Diabetes Mother   . Hyperlipidemia Mother   . Hypertension Mother     Social History   Tobacco Use  . Smoking status: Never Smoker  . Smokeless tobacco: Never Used  Vaping Use  . Vaping Use: Never used  Substance Use Topics  . Alcohol use: No  . Drug use: No    Allergies:  Allergies  Allergen Reactions   . Pollen Extract Other (See Comments)    Migraines    Medications Prior to Admission  Medication Sig Dispense Refill Last Dose  . acetaminophen (TYLENOL) 325 MG tablet Take 2 tablets (650 mg total) by mouth every 4 (four) hours as needed (for pain scale < 4). 90 tablet 1 05/12/2020 at 1200  . doxylamine, Sleep, (UNISOM) 25 MG tablet Take 12.5 mg by mouth at bedtime as needed.   05/12/2020 at 1200  . pyridOXINE (VITAMIN B-6) 25 MG tablet Take 25 mg by mouth daily.   05/12/2020 at 1200  . acetaminophen (TYLENOL) 500 MG tablet Take 1,000 mg by mouth every 6 (six) hours as needed for mild pain.   More than a month at Unknown time  . Blood Pressure Monitoring (BLOOD PRESSURE CUFF) MISC 1 Device by Does not apply route once a week. 1 each 0 More than a month at Unknown time  . cyclobenzaprine (FLEXERIL) 10 MG tablet Take 1 tablet (10 mg total) by mouth every 8 (eight) hours as needed for muscle spasms. 30 tablet 1 More than a month at Unknown time  . Elastic Bandages & Supports (COMFORT FIT  MATERNITY SUPP LG) MISC 1 Units daily by Does not apply route. 1 each 0 More than a month at Unknown time  . ibuprofen (ADVIL) 600 MG tablet Take 1 tablet (600 mg total) by mouth every 6 (six) hours. 30 tablet 0 More than a month at Unknown time  . Misc. Devices MISC Dispense one maternity belt for patient 1 Device 0 More than a month at Unknown time  . norethindrone (MICRONOR) 0.35 MG tablet Take 1 tablet (0.35 mg total) by mouth daily. 1 Package 11 More than a month at Unknown time  . Prenatal Vit-Fe Phos-FA-Omega (VITAFOL GUMMIES) 3.33-0.333-34.8 MG CHEW Chew 3 each by mouth as directed. Chew 3 Once a Day 90 tablet 11 More than a month at Unknown time  . Vitamin D, Ergocalciferol, (DRISDOL) 50000 units CAPS capsule Take 1 capsule (50,000 Units total) by mouth every 7 (seven) days. (Patient not taking: No sig reported) 30 capsule 2 More than a month at Unknown time    Review of Systems  Gastrointestinal: Positive for  abdominal pain.  Genitourinary: Positive for vaginal bleeding.  All other systems reviewed and are negative.  Physical Exam   Blood pressure 116/71, pulse 78, temperature 99.1 F (37.3 C), temperature source Oral, resp. rate 20, height 5\' 4"  (1.626 m), weight 83.4 kg, last menstrual period 03/18/2020, unknown if currently breastfeeding.  Physical Exam Vitals and nursing note reviewed. Exam conducted with a chaperone present.  Constitutional:      Appearance: She is well-developed. She is obese. She is not ill-appearing.  Cardiovascular:     Rate and Rhythm: Normal rate.     Heart sounds: Normal heart sounds.  Pulmonary:     Effort: Pulmonary effort is normal.     Breath sounds: Normal breath sounds.  Abdominal:     General: Bowel sounds are normal.     Palpations: Abdomen is soft.     Tenderness: There is no abdominal tenderness. There is no right CVA tenderness or left CVA tenderness.  Genitourinary:    Comments: Pelvic deferred due to description of scant bleeding, reassuring ultrasound Skin:    Capillary Refill: Capillary refill takes less than 2 seconds.  Neurological:     Mental Status: She is alert and oriented to person, place, and time.  Psychiatric:        Mood and Affect: Mood normal.        Behavior: Behavior normal.    MAU Course  Procedures  Orders Placed This Encounter  Procedures  . Wet prep, genital    Standing Status:   Standing    Number of Occurrences:   1  . 05/16/2020 OB Comp Less 14 Wks    Standing Status:   Standing    Number of Occurrences:   1    Order Specific Question:   Symptom/Reason for Exam    Answer:   Pregnancy of unknown anatomic location [place]  . CBC    Standing Status:   Standing    Number of Occurrences:   1  . Comprehensive metabolic panel    Standing Status:   Standing    Number of Occurrences:   1  . hCG, quantitative, pregnancy    Standing Status:   Standing    Number of Occurrences:   1  . Urinalysis, Routine w reflex  microscopic Urine, Clean Catch    Standing Status:   Standing    Number of Occurrences:   1  . Nursing communication    RN to collect  vaginal swabs    Standing Status:   Standing    Number of Occurrences:   1  . Pregnancy, urine POC    Standing Status:   Standing    Number of Occurrences:   1    Results for orders placed or performed during the hospital encounter of 05/12/20 (from the past 24 hour(s))  Pregnancy, urine POC     Status: Abnormal   Collection Time: 05/12/20  7:34 PM  Result Value Ref Range   Preg Test, Ur POSITIVE (A) NEGATIVE  CBC     Status: Abnormal   Collection Time: 05/12/20  8:10 PM  Result Value Ref Range   WBC 10.4 4.0 - 10.5 K/uL   RBC 4.47 3.87 - 5.11 MIL/uL   Hemoglobin 11.4 (L) 12.0 - 15.0 g/dL   HCT 96.2 (L) 22.9 - 79.8 %   MCV 80.1 80.0 - 100.0 fL   MCH 25.5 (L) 26.0 - 34.0 pg   MCHC 31.8 30.0 - 36.0 g/dL   RDW 92.1 19.4 - 17.4 %   Platelets 316 150 - 400 K/uL   nRBC 0.0 0.0 - 0.2 %  Comprehensive metabolic panel     Status: None   Collection Time: 05/12/20  8:10 PM  Result Value Ref Range   Sodium 135 135 - 145 mmol/L   Potassium 3.5 3.5 - 5.1 mmol/L   Chloride 104 98 - 111 mmol/L   CO2 25 22 - 32 mmol/L   Glucose, Bld 89 70 - 99 mg/dL   BUN 16 6 - 20 mg/dL   Creatinine, Ser 0.81 0.44 - 1.00 mg/dL   Calcium 9.4 8.9 - 44.8 mg/dL   Total Protein 7.1 6.5 - 8.1 g/dL   Albumin 3.7 3.5 - 5.0 g/dL   AST 17 15 - 41 U/L   ALT 16 0 - 44 U/L   Alkaline Phosphatase 38 38 - 126 U/L   Total Bilirubin 0.4 0.3 - 1.2 mg/dL   GFR, Estimated >18 >56 mL/min   Anion gap 6 5 - 15  hCG, quantitative, pregnancy     Status: Abnormal   Collection Time: 05/12/20  8:10 PM  Result Value Ref Range   hCG, Beta Chain, [name], S 77,756 (H) <5 mIU/mL  Wet prep, genital     Status: Abnormal   Collection Time: 05/12/20  8:20 PM   Specimen: Vaginal  Result Value Ref Range   Yeast Wet Prep HPF POC NONE SEEN NONE SEEN   Trich, Wet Prep NONE SEEN NONE SEEN   Clue Cells  Wet Prep HPF POC NONE SEEN NONE SEEN   WBC, Wet Prep HPF POC MODERATE (A) NONE SEEN   Sperm NONE SEEN   Urinalysis, Routine w reflex microscopic Urine, Clean Catch     Status: Abnormal   Collection Time: 05/12/20  9:09 PM  Result Value Ref Range   Color, Urine YELLOW YELLOW   APPearance CLEAR CLEAR   Specific Gravity, Urine 1.024 1.005 - 1.030   pH 5.0 5.0 - 8.0   Glucose, UA NEGATIVE NEGATIVE mg/dL   Hgb urine dipstick SMALL (A) NEGATIVE   Bilirubin Urine NEGATIVE NEGATIVE   Ketones, ur NEGATIVE NEGATIVE mg/dL   Protein, ur NEGATIVE NEGATIVE mg/dL   Nitrite NEGATIVE NEGATIVE   Leukocytes,Ua NEGATIVE NEGATIVE   RBC / HPF 6-10 0 - 5 RBC/hpf   Bacteria, UA NONE SEEN NONE SEEN   Squamous Epithelial / LPF 0-5 0 - 5   Mucus PRESENT    US OB Comp  Less 14 Wks  Result Date: 05/12/2020 CLINICAL DATA:  Pelvic pain and vaginal spotting. EXAM: OBSTETRIC <14 WK ULTRASOUND TECHNIQUE: Transabdominal ultrasound was performed for evaluation of the gestation as well as the maternal uterus and adnexal regions. COMPARISON:  None. FINDINGS: Intrauterine gestational sac: Single Yolk sac:  Visualized. Embryo:  Visualized. Cardiac Activity: Visualized. Heart Rate: 138 bpm CRL:   7.7 mm   6 w 4 d                  [facility] Subchorionic hemorrhage:  None visualized. Maternal uterus/adnexae: The bilateral ovaries are visualized and are normal in appearance. No pelvic free fluid is seen. IMPRESSION: Single, viable intrauterine pregnancy at approximately 6 weeks and 4 days gestation by ultrasound evaluation. Electronically Signed   By: Aram Candelahaddeus  Houston M.D.   On: 05/12/2020 21:17   Assessment and Plan  --30 y.o. [id number] with confirmed live IUP at 2113w4d  --GC/C swab in work --Blood type B POS --Tylenol PRN for pain, given list of safe medications in pregnancy --Discharge home in stable condition  F/U: --Desired pregnancy. Patient to initiate prenatal care at 10-[redacted] weeks GA  Calvert CantorSamantha C Naja [name],  CNM 05/12/2020, 10:08 PM

## 2020-05-15 LAB — GC/CHLAMYDIA PROBE AMP (~~LOC~~) NOT AT ARMC
Chlamydia: NEGATIVE
Comment: NEGATIVE
Comment: NORMAL
Neisseria Gonorrhea: NEGATIVE

## 2020-05-22 ENCOUNTER — Other Ambulatory Visit: Payer: Self-pay

## 2020-05-22 ENCOUNTER — Inpatient Hospital Stay (HOSPITAL_COMMUNITY)
Admission: AD | Admit: 2020-05-22 | Discharge: 2020-05-22 | Disposition: A | Discharge status: Home / Self Care | Payer: Medicaid Other | Attending: Family Medicine | Admitting: Family Medicine

## 2020-05-22 ENCOUNTER — Encounter (HOSPITAL_COMMUNITY): Payer: Self-pay | Admitting: Family Medicine

## 2020-05-22 DIAGNOSIS — K219 Gastro-esophageal reflux disease without esophagitis: Secondary | ICD-10-CM

## 2020-05-22 DIAGNOSIS — O219 Vomiting of pregnancy, unspecified: Secondary | ICD-10-CM

## 2020-05-22 DIAGNOSIS — K5900 Constipation, unspecified: Secondary | ICD-10-CM | POA: Diagnosis not present

## 2020-05-22 DIAGNOSIS — Z3A08 8 weeks gestation of pregnancy: Secondary | ICD-10-CM | POA: Diagnosis not present

## 2020-05-22 DIAGNOSIS — R109 Unspecified abdominal pain: Secondary | ICD-10-CM | POA: Diagnosis not present

## 2020-05-22 DIAGNOSIS — O26891 Other specified pregnancy related conditions, first trimester: Secondary | ICD-10-CM

## 2020-05-22 DIAGNOSIS — O99611 Diseases of the digestive system complicating pregnancy, first trimester: Secondary | ICD-10-CM | POA: Diagnosis not present

## 2020-05-22 DIAGNOSIS — O26899 Other specified pregnancy related conditions, unspecified trimester: Secondary | ICD-10-CM

## 2020-05-22 LAB — URINALYSIS, ROUTINE W REFLEX MICROSCOPIC
Bilirubin Urine: NEGATIVE
Glucose, UA: NEGATIVE mg/dL
Ketones, ur: NEGATIVE mg/dL
Leukocytes,Ua: NEGATIVE
Nitrite: NEGATIVE
Protein, ur: NEGATIVE mg/dL
Specific Gravity, Urine: 1.027 (ref 1.005–1.030)
pH: 6 (ref 5.0–8.0)

## 2020-05-22 MED ORDER — ACETAMINOPHEN 500 MG PO TABS
1000.0000 mg | ORAL_TABLET | Freq: Once | ORAL | Status: AC
  Administered 2020-05-22: 1000 mg via ORAL
  Filled 2020-05-22: qty 2

## 2020-05-22 MED ORDER — METOCLOPRAMIDE HCL 10 MG PO TABS: 10.0000 mg | ORAL_TABLET | Freq: Three times a day (TID) | ORAL | 0 refills | Status: AC | PRN

## 2020-05-22 NOTE — MAU Note (Signed)
This morning, started having pain in upper back and lower stomach, also noted a brown d/c.

## 2020-05-22 NOTE — Discharge Instructions (Signed)
You have constipation which is hard stools that are difficult to pass. It is important to have regular bowel movements every 1-3 days that are soft and easy to pass. Hard stools increase your risk of hemorrhoids and are very uncomfortable.   To prevent constipation you can increase the amount of fiber in your diet. Examples of foods with fiber are leafy greens, whole grain breads, oatmeal and other grains.  It is also important to drink at least eight 8oz glass of water everyday.   If you have not has a bowel movement in 4-5 days you made need to clean out your bowel.  This will have establish normal movement through your bowel.    Miralax Clean out  Take 8 capfuls of miralax in 64 oz of gatorade. You can use any fluid that appeals to you (gatorade, water, juice)  Continue to drink at least eight 8 oz glasses of water throughout the day  You can repeat with another 8 capfuls of miralax in 64 oz of gatorade if you are not having a large amount of stools  You will need to be at home and close to a bathroom for about 8 hours when you do the above as you may need to go to the bathroom frequently.   After you are cleaned out: - Start Colace100mg  twice daily - Start Miralax once daily - Start a daily fiber supplement like metamucil or citrucel - You can safely use enemas in pregnancy  - if you are having diarrhea you can reduce to Colace once a day or miralax every other day or a 1/2 capful daily.        [facility] [employer] for [employer] at [facility]  [address] [place], [place] [address]  [phone]  Center for [facility] at [facility]  [address], [place], [place] [address]  [phone]  Center for [facility] at [facility] [address] [place], [place] [address]  [phone]  Center for [facility] at [facility]  [address] [place], [place] [address]  [phone]  Center for [facility]  Healthcare at [facility]  [address] (First floor), [place], [place] [address]  [phone]  Center for [facility] [address], [place], [place] [address] [phone]  Center for [facility] at [facility]  [address] [place], [place], [place] [address]  [phone]  [facility]  [address], [place], [place] [address]  [phone]  [facility]  [address] [place], [place], [place] [address]  [phone]  [facility]  [address] [place], [place] [address]  [phone]  [facility]  [address] [place], [place] [address]  [phone]  [facility]  [address], [place], [place] [address]  [phone]  [facility]   [address] [place], [place] [address]  [phone]  [facility]  [address], [place], [place] [address]   [phone]  [facility]  [address], [place], [place] [address]  [phone]            Constipation, Adult Constipation is when a person has fewer than three bowel movements in a week, has difficulty having a bowel movement, or has stools (feces) that are dry, hard, or larger than normal. Constipation may be caused by an underlying condition.  It may become worse with age if a person takes certain medicines and does not take in enough fluids. Follow these instructions at home: Eating and drinking  Eat foods that have a lot of fiber, such as beans, whole grains, and fresh fruits and vegetables.  Limit foods that are low in fiber and high in fat and processed sugars, such as fried or sweet foods. These include french fries, hamburgers, cookies, candies, and soda.  Drink enough fluid to keep your urine pale yellow.   General instructions  Exercise regularly or as told by your health care provider. Try to do 150 minutes of moderate exercise each week.  Use the bathroom when you  have the urge to go. Do not hold it in.  Take over-the-counter and prescription medicines only as told by your health care provider. This includes any fiber supplements.  During bowel movements: ? Practice deep breathing while relaxing the lower abdomen. ? Practice pelvic floor relaxation.  Watch your condition for any changes. Let your health care provider know about them.  Keep all follow-up visits as told by your health care provider. This is important. Contact a health care provider if:  You have pain that gets worse.  You have a fever.  You do not have a bowel movement after 4 days.  You vomit.  You are not hungry or you lose weight.  You are bleeding from the opening between the buttocks (anus).  You have thin, pencil-like stools. Get help right away if:  You have a fever and your symptoms suddenly get worse.  You leak stool or have blood in your stool.  Your abdomen is bloated.  You have severe pain in your abdomen.  You feel dizzy or you faint. Summary  Constipation is when a person has fewer than three bowel movements in a week, has difficulty having a bowel movement, or has stools (feces) that are dry, hard, or larger than normal.  Eat foods that have a lot of fiber, such as beans, whole grains, and fresh fruits and vegetables.  Drink enough fluid to keep your urine pale yellow.  Take over-the-counter and prescription medicines only as told by your health care provider. This includes any fiber supplements. This information is not intended to replace advice given to you by your health care provider. Make sure you discuss any questions you have with your health care provider. Document Revised: 12/09/2018 Document Reviewed: 12/09/2018 Elsevier Patient Education  2021 [employer].

## 2020-05-22 NOTE — MAU Provider Note (Signed)
History     CSN: [id number]  Arrival date and time: 05/22/20 1421   Event Date/Time   First Provider Initiated Contact with Patient 05/22/20 1511      Chief Complaint  Patient presents with  . Abdominal Pain   HPI [name] is a 30 y.o. [id number] at [redacted]w[redacted]d who presents with abdominal cramping and back pain. Symptoms started this morning. Reports mid lower abdominal cramping. Also feels aching in her upper mid back. Rates pain 7/10. Nothing makes pain better or worse. Hasn't treated symptoms. Also saw some brown blood on toilet paper this morning. Has been having n/v with the pregnancy that she has treated with vit b6/unisom with minimal relief.  Denies epigastric pain, fever, dysuria, or vaginal discharge. Last BM was yesterday but has been having issues with constipation - took a dose of colace yesterday.  No recent intercourse.   OB History    Gravida  8   Para  5   Term  5   Preterm  0   AB  2   Living  5     SAB  0   IAB  1   Ectopic  1   Multiple  0   Live Births  5           Past Medical History:  Diagnosis Date  . Anemia   . Family history of Turner syndrome 2008   w/ first pregnancy    Past Surgical History:  Procedure Laterality Date  . NO PAST SURGERIES      Family History  Problem Relation Age of Onset  . Diabetes Mother   . Hyperlipidemia Mother   . Hypertension Mother     Social History   Tobacco Use  . Smoking status: Never Smoker  . Smokeless tobacco: Never Used  Vaping Use  . Vaping Use: Never used  Substance Use Topics  . Alcohol use: No  . Drug use: No    Allergies:  Allergies  Allergen Reactions  . Pollen Extract Other (See Comments)    Migraines    Medications Prior to Admission  Medication Sig Dispense Refill Last Dose  . doxylamine, Sleep, (UNISOM) 25 MG tablet Take 12.5 mg by mouth at bedtime as needed.   05/22/2020 at Unknown time  . pyridOXINE (VITAMIN B-6) 25 MG tablet Take 25 mg by mouth daily.    05/22/2020 at Unknown time  . acetaminophen (TYLENOL) 325 MG tablet Take 2 tablets (650 mg total) by mouth every 4 (four) hours as needed (for pain scale < 4). 90 tablet 1   . Prenatal Vit-Fe Phos-FA-Omega (VITAFOL GUMMIES) 3.33-0.333-34.8 MG CHEW Chew 3 each by mouth as directed. Chew 3 Once a Day 90 tablet 11     Review of Systems  Constitutional: Negative.   Gastrointestinal: Positive for abdominal pain, constipation, nausea and vomiting. Negative for diarrhea.  Genitourinary: Positive for vaginal bleeding. Negative for dysuria and vaginal discharge.  Musculoskeletal: Positive for back pain.   Physical Exam   Blood pressure (!) 118/52, pulse 84, temperature 98.3 F (36.8 C), temperature source Oral, resp. rate 16, height 5\' 4"  (1.626 m), weight 82 kg, last menstrual period 03/18/2020, SpO2 100 %, unknown if currently breastfeeding.  Physical Exam Vitals and nursing note reviewed. Exam conducted with a chaperone present.  Constitutional:      General: She is not in acute distress.    Appearance: She is well-developed.  HENT:     Head: Normocephalic and atraumatic.  Pulmonary:  Effort: Pulmonary effort is normal. No respiratory distress.  Abdominal:     General: Abdomen is flat.     Palpations: Abdomen is soft.     Tenderness: There is no abdominal tenderness. There is no right CVA tenderness or left CVA tenderness.  Genitourinary:    General: Normal vulva.     Vagina: No bleeding.     Cervix: No cervical bleeding.     Comments: Cervix closed. Tan discharge. No blood Skin:    General: Skin is warm and dry.  Neurological:     Mental Status: She is alert.  Psychiatric:        Mood and Affect: Mood normal.        Behavior: Behavior normal.     MAU Course  Procedures Results for orders placed or performed during the hospital encounter of 05/22/20 (from the past 24 hour(s))  Urinalysis, Routine w reflex microscopic Urine, Clean Catch     Status: Abnormal   Collection  Time: 05/22/20  3:26 PM  Result Value Ref Range   Color, Urine YELLOW YELLOW   APPearance HAZY (A) CLEAR   Specific Gravity, Urine 1.027 1.005 - 1.030   pH 6.0 5.0 - 8.0   Glucose, UA NEGATIVE NEGATIVE mg/dL   Hgb urine dipstick MODERATE (A) NEGATIVE   Bilirubin Urine NEGATIVE NEGATIVE   Ketones, ur NEGATIVE NEGATIVE mg/dL   Protein, ur NEGATIVE NEGATIVE mg/dL   Nitrite NEGATIVE NEGATIVE   Leukocytes,Ua NEGATIVE NEGATIVE   RBC / HPF 0-5 0 - 5 RBC/hpf   WBC, UA 0-5 0 - 5 WBC/hpf   Bacteria, UA RARE (A) NONE SEEN   Squamous Epithelial / LPF 21-50 0 - 5   Mucus PRESENT    MDM Pt informed that the ultrasound is considered a limited OB ultrasound and is not intended to be a complete ultrasound exam.  Patient also informed that the ultrasound is not being completed with the intent of assessing for fetal or placental anomalies or any pelvic abnormalities.  Explained that the purpose of today's ultrasound is to assess for  viability.  Patient acknowledges the purpose of the exam and the limitations of the study.  IUP with FHR 174 bpm  Abdominal cramping - benign abdominal exam and cervix closed. Not improved much with tylenol given in MAU. Discussed treatment of constipation - offered enema but patient's preference is at home treatment. Discussed management of constipation at home including stool softeners & laxatives.   Back pain - likely MSK. Relieved with tylenol.   Vaginal bleeding - no bleeding on exam. Cervix closed. RH positive. Pt had negative vaginal swabs 10 days ago and declines new exposures.   No vomiting in MAU & no signs of dehydration. Offered rx reglan to take with b6/unisom. Reviewed dietary changes (has only eaten chicken wings today).     Assessment and Plan   1. Nausea and vomiting during pregnancy prior to [redacted] weeks gestation  -rx reglan  2. Abdominal cramping affecting pregnancy  -reviewed SAB precautions & reasons to return to MAU  3. Constipation during pregnancy  in first trimester  -take colace daily. Laxatives prn. Increase water intake  4. [redacted] weeks gestation of pregnancy -has ob appt scheduled      Judeth Horn 05/22/2020, 3:11 PM

## 2020-05-24 LAB — CULTURE, OB URINE
Culture: >=100000 — AB
Special Requests: NORMAL

## 2020-06-12 ENCOUNTER — Inpatient Hospital Stay (HOSPITAL_COMMUNITY): Payer: Medicaid Other

## 2020-06-12 ENCOUNTER — Inpatient Hospital Stay (HOSPITAL_COMMUNITY)
Admission: AD | Admit: 2020-06-12 | Discharge: 2020-06-12 | Disposition: A | Discharge status: Home / Self Care | Payer: Medicaid Other | Attending: Emergency Medicine | Admitting: Emergency Medicine

## 2020-06-12 ENCOUNTER — Other Ambulatory Visit: Payer: Self-pay

## 2020-06-12 ENCOUNTER — Encounter (HOSPITAL_COMMUNITY): Payer: Self-pay | Admitting: Obstetrics and Gynecology

## 2020-06-12 DIAGNOSIS — S40012A Contusion of left shoulder, initial encounter: Secondary | ICD-10-CM

## 2020-06-12 DIAGNOSIS — O26891 Other specified pregnancy related conditions, first trimester: Secondary | ICD-10-CM | POA: Diagnosis not present

## 2020-06-12 DIAGNOSIS — O9A211 Injury, poisoning and certain other consequences of external causes complicating pregnancy, first trimester: Secondary | ICD-10-CM | POA: Insufficient documentation

## 2020-06-12 DIAGNOSIS — M545 Low back pain, unspecified: Secondary | ICD-10-CM | POA: Insufficient documentation

## 2020-06-12 DIAGNOSIS — R1030 Lower abdominal pain, unspecified: Secondary | ICD-10-CM | POA: Diagnosis not present

## 2020-06-12 DIAGNOSIS — S7012XA Contusion of left thigh, initial encounter: Secondary | ICD-10-CM

## 2020-06-12 DIAGNOSIS — Z3A11 11 weeks gestation of pregnancy: Secondary | ICD-10-CM | POA: Insufficient documentation

## 2020-06-12 LAB — URINALYSIS, ROUTINE W REFLEX MICROSCOPIC
Bilirubin Urine: NEGATIVE
Glucose, UA: NEGATIVE mg/dL
Hgb urine dipstick: NEGATIVE
Ketones, ur: NEGATIVE mg/dL
Leukocytes,Ua: NEGATIVE
Nitrite: NEGATIVE
Protein, ur: NEGATIVE mg/dL
Specific Gravity, Urine: 1.028 (ref 1.005–1.030)
pH: 6 (ref 5.0–8.0)

## 2020-06-12 MED ORDER — ACETAMINOPHEN 500 MG PO TABS
1000.0000 mg | ORAL_TABLET | Freq: Once | ORAL | Status: AC
  Administered 2020-06-12: 1000 mg via ORAL
  Filled 2020-06-12: qty 2
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
## 2020-06-12 NOTE — ED Notes (Signed)
Pt verbalizes understanding of discharge instructions. Opportunity for questions and answers were provided. Armband removed by staff, pt discharged from the ED.  
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
## 2020-06-13 ENCOUNTER — Other Ambulatory Visit: Payer: Self-pay

## 2020-06-13 ENCOUNTER — Inpatient Hospital Stay (HOSPITAL_COMMUNITY)
Admission: AD | Admit: 2020-06-13 | Discharge: 2020-06-13 | Disposition: A | Discharge status: Home / Self Care | Payer: Medicaid Other | Attending: Obstetrics and Gynecology | Admitting: Obstetrics and Gynecology

## 2020-06-13 DIAGNOSIS — O26891 Other specified pregnancy related conditions, first trimester: Secondary | ICD-10-CM | POA: Insufficient documentation

## 2020-06-13 DIAGNOSIS — O99891 Other specified diseases and conditions complicating pregnancy: Secondary | ICD-10-CM

## 2020-06-13 DIAGNOSIS — Z3A11 11 weeks gestation of pregnancy: Secondary | ICD-10-CM | POA: Insufficient documentation

## 2020-06-13 DIAGNOSIS — Z3401 Encounter for supervision of normal first pregnancy, first trimester: Secondary | ICD-10-CM

## 2020-06-13 DIAGNOSIS — R739 Hyperglycemia, unspecified: Secondary | ICD-10-CM | POA: Insufficient documentation

## 2020-06-13 LAB — BASIC METABOLIC PANEL
Anion gap: 5 (ref 5–15)
BUN: 9 mg/dL (ref 6–20)
CO2: 24 mmol/L (ref 22–32)
Calcium: 9 mg/dL (ref 8.9–10.3)
Chloride: 106 mmol/L (ref 98–111)
Creatinine, Ser: 0.62 mg/dL (ref 0.44–1.00)
GFR, Estimated: >60 mL/min (ref >60)
Glucose, Bld: 78 mg/dL (ref 70–99)
Potassium: 3.3 mmol/L — ABNORMAL LOW (ref 3.5–5.1)
Sodium: 135 mmol/L (ref 135–145)

## 2020-06-13 LAB — CBC WITH DIFFERENTIAL/PLATELET
Abs Immature Granulocytes: 0.01 10*3/uL (ref 0.00–0.07)
Basophils Absolute: 0 10*3/uL (ref 0.0–0.1)
Basophils Relative: 0 %
Eosinophils Absolute: 0.4 10*3/uL (ref 0.0–0.5)
Eosinophils Relative: 4 %
HCT: 35.6 % — ABNORMAL LOW (ref 36.0–46.0)
Hemoglobin: 11.4 g/dL — ABNORMAL LOW (ref 12.0–15.0)
Immature Granulocytes: 0 %
Lymphocytes Relative: 28 %
Lymphs Abs: 2.6 10*3/uL (ref 0.7–4.0)
MCH: 25.7 pg — ABNORMAL LOW (ref 26.0–34.0)
MCHC: 32 g/dL (ref 30.0–36.0)
MCV: 80.2 fL (ref 80.0–100.0)
Monocytes Absolute: 0.7 10*3/uL (ref 0.1–1.0)
Monocytes Relative: 8 %
Neutro Abs: 5.4 10*3/uL (ref 1.7–7.7)
Neutrophils Relative %: 60 %
Platelets: 338 10*3/uL (ref 150–400)
RBC: 4.44 MIL/uL (ref 3.87–5.11)
RDW: 13.8 % (ref 11.5–15.5)
WBC: 9.1 10*3/uL (ref 4.0–10.5)
nRBC: 0 % (ref 0.0–0.2)

## 2020-06-13 LAB — HEMOGLOBIN A1C
Hgb A1c MFr Bld: 5.2 % (ref 4.8–5.6)
Mean Plasma Glucose: 102.54 mg/dL

## 2020-06-13 LAB — GLUCOSE, CAPILLARY: Glucose-Capillary: 130 mg/dL — ABNORMAL HIGH (ref 70–99)

## 2020-06-13 NOTE — MAU Note (Signed)
RN went to family room where patient was waiting to discuss discharge information and patient was not in the room.  RN looked around the unit and called for the patient in the waiting room and was unable to find the patient.

## 2020-06-13 NOTE — MAU Provider Note (Signed)
History     CSN: [id number]  Arrival date and time: 06/13/20 1725   Event Date/Time   First Provider Initiated Contact with Patient 06/13/20 1837      Chief Complaint  Patient presents with  . Elevated Blood Glucose   HPI   [name] is a 30 y.o. [id number] at [redacted]w[redacted]d based on 6w [place]. Patient came in today due to elevated BS at home with home monitoring. Patient is NOT a diabetic, she has NOT been diagnosed as GDM. Patient is a [employer] fund[employer]. She checked her BS at home due to "not feeling well" and due to her brother and mother having diabetes, wanted to see if that was a cause of her not feeling well. She used her brother's glucometer last night, found to have BS 202, this AM fasting 101, and repeat again when she again "did not feel right", with flushing and headache/head pressure and lightheadedness (has since resolved), and had a BS of 567, which is why she came to MAU.   Upon arrival in MAU she had a POC BS of 130. Denies F/C, N/V/D, abdominal pain. Denies SOB, CP, cough, sinus congestion or runny nose.   OB History    Gravida  8   Para  5   Term  5   Preterm  0   AB  2   Living  5     SAB  0   IAB  1   Ectopic  1   Multiple  0   Live Births  5           Past Medical History:  Diagnosis Date  . Anemia   . Family history of Turner syndrome 2008   w/ first pregnancy    Past Surgical History:  Procedure Laterality Date  . NO PAST SURGERIES      Family History  Problem Relation Age of Onset  . Diabetes Mother   . Hyperlipidemia Mother   . Hypertension Mother     Social History   Tobacco Use  . Smoking status: Never Smoker  . Smokeless tobacco: Never Used  Vaping Use  . Vaping Use: Never used  Substance Use Topics  . Alcohol use: No  . Drug use: No    Allergies:  Allergies  Allergen Reactions  . Pollen Extract Other (See Comments)    Migraines    Medications Prior to Admission  Medication Sig Dispense Refill Last Dose  . acetaminophen  (TYLENOL) 325 MG tablet Take 2 tablets (650 mg total) by mouth every 4 (four) hours as needed (for pain scale < 4). 90 tablet 1   . doxylamine, Sleep, (UNISOM) 25 MG tablet Take 12.5 mg by mouth at bedtime as needed.     . metoCLOPramide (REGLAN) 10 MG tablet Take 1 tablet (10 mg total) by mouth every 8 (eight) hours as needed for nausea. 30 tablet 0   . Prenatal Vit-Fe Phos-FA-Omega (VITAFOL GUMMIES) 3.33-0.333-34.8 MG CHEW Chew 3 each by mouth as directed. Chew 3 Once a Day 90 tablet 11   . pyridOXINE (VITAMIN B-6) 25 MG tablet Take 25 mg by mouth daily.       Review of Systems  Constitutional: Negative for chills and fever.  HENT: Negative for congestion, rhinorrhea, sinus pressure and sinus pain.   Respiratory: Negative for cough, shortness of breath and wheezing.   Cardiovascular: Negative for chest pain.  Gastrointestinal: Negative for diarrhea, nausea and vomiting.  Genitourinary: Negative for pelvic pain and vaginal bleeding.  Neurological: Negative  for dizziness and headaches.   Physical Exam   Blood pressure 118/61, pulse 92, temperature 98.4 F (36.9 C), temperature source Oral, resp. rate 20, height 5\' 4"  (1.626 m), weight 82.6 kg, last menstrual period 03/18/2020, SpO2 100 %, unknown if currently breastfeeding.  Physical Exam Constitutional:      General: She is not in acute distress.    Appearance: Normal appearance. She is not ill-appearing.  HENT:     Head: Normocephalic and atraumatic.  Eyes:     Extraocular Movements: Extraocular movements intact.  Cardiovascular:     Rate and Rhythm: Normal rate.  Pulmonary:     Effort: Pulmonary effort is normal.  Musculoskeletal:        General: Normal range of motion.  Skin:    General: Skin is warm.  Neurological:     General: No focal deficit present.     Mental Status: She is alert and oriented to person, place, and time.  Psychiatric:        Mood and Affect: Mood normal.        Behavior: Behavior normal.         Thought Content: Thought content normal.        Judgment: Judgment normal.     MAU Course  Procedures  MDM POC BG 130  Labs to confirm (2/2 home POC BG 560s 2 hours previous)- BMP, A1C, CBC w/ diff Labs confirmed no evidence of GDM or pre-existing DM. OK for discharge home. Follow up with [facility] provider outpatient.  Assessment and Plan  1. Elevated blood sugar  2. Encounter for supervision of normal first pregnancy in first trimester  Labs are all normal, no evidence of elevated BS or diabetes, follow up outpatient. Patient feels fine now, no symptoms, would like to go home.   [facility], DO 06/13/2020, 8:40 PM

## 2020-06-13 NOTE — MAU Note (Signed)
Presents stating checked blood sugar @ home due to not feeling well, BS 567.  States no history of diabetes, but it does run in family.  Denies VB or LOF.

## 2022-05-09 ENCOUNTER — Ambulatory Visit (HOSPITAL_COMMUNITY)
Admission: EM | Admit: 2022-05-09 | Discharge: 2022-05-09 | Disposition: A | Discharge status: Home / Self Care | Payer: Medicaid Other | Attending: Family Medicine | Admitting: Family Medicine

## 2022-05-09 NOTE — ED Notes (Signed)
Called from lobby no answer X 2  

## 2022-05-09 NOTE — ED Notes (Signed)
Called from lobby no answer X 1
